# Patient Record
Sex: Male | Born: 1985 | Race: White | Hispanic: No | Marital: Married | Smoking: Former smoker
Health system: Southern US, Community
[De-identification: ages and names within clinical notes are randomized; demographics above are authoritative.]

## PROBLEM LIST (undated history)

## (undated) DIAGNOSIS — K5090 Crohn's disease, unspecified, without complications: Secondary | ICD-10-CM

## (undated) DIAGNOSIS — K648 Other hemorrhoids: Secondary | ICD-10-CM

## (undated) DIAGNOSIS — K611 Rectal abscess: Secondary | ICD-10-CM

## (undated) DIAGNOSIS — K602 Anal fissure, unspecified: Secondary | ICD-10-CM

## (undated) DIAGNOSIS — D239 Other benign neoplasm of skin, unspecified: Secondary | ICD-10-CM

## (undated) HISTORY — PX: WISDOM TOOTH EXTRACTION: SHX21

## (undated) HISTORY — DX: Anal fissure, unspecified: K60.2

## (undated) HISTORY — DX: Other benign neoplasm of skin, unspecified: D23.9

## (undated) HISTORY — DX: Crohn's disease, unspecified, without complications: K50.90

## (undated) HISTORY — DX: Other hemorrhoids: K64.8

## (undated) HISTORY — PX: OTHER SURGICAL HISTORY: SHX169

## (undated) HISTORY — DX: Rectal abscess: K61.1

---

## 2005-03-31 HISTORY — PX: ARTHROSCOPIC REPAIR ACL: SUR80

## 2009-08-22 ENCOUNTER — Ambulatory Visit: Payer: Self-pay | Admitting: Family Medicine

## 2009-08-22 DIAGNOSIS — D239 Other benign neoplasm of skin, unspecified: Secondary | ICD-10-CM | POA: Insufficient documentation

## 2009-08-22 HISTORY — DX: Other benign neoplasm of skin, unspecified: D23.9

## 2009-08-23 LAB — CONVERTED CEMR LAB
AST: 22 units/L (ref 0–37)
Albumin: 4.8 g/dL (ref 3.5–5.2)
Basophils Absolute: 0 10*3/uL (ref 0.0–0.1)
CO2: 28 meq/L (ref 19–32)
Chlamydia, Swab/Urine, PCR: NEGATIVE
Eosinophils Absolute: 0.1 10*3/uL (ref 0.0–0.7)
Glucose, Bld: 78 mg/dL (ref 70–99)
HCT: 44.1 % (ref 39.0–52.0)
Hemoglobin: 15.4 g/dL (ref 13.0–17.0)
Lymphs Abs: 1.6 10*3/uL (ref 0.7–4.0)
MCHC: 35 g/dL (ref 30.0–36.0)
Monocytes Relative: 10.5 % (ref 3.0–12.0)
Neutro Abs: 2.4 10*3/uL (ref 1.4–7.7)
Potassium: 4 meq/L (ref 3.5–5.1)
RDW: 12.8 % (ref 11.5–14.6)
Sodium: 143 meq/L (ref 135–145)
TSH: 1.23 microintl units/mL (ref 0.35–5.50)

## 2009-09-25 ENCOUNTER — Encounter: Payer: Self-pay | Admitting: Family Medicine

## 2009-10-18 ENCOUNTER — Ambulatory Visit: Payer: Self-pay | Admitting: Family Medicine

## 2009-10-18 DIAGNOSIS — L989 Disorder of the skin and subcutaneous tissue, unspecified: Secondary | ICD-10-CM | POA: Insufficient documentation

## 2009-10-18 DIAGNOSIS — D485 Neoplasm of uncertain behavior of skin: Secondary | ICD-10-CM | POA: Insufficient documentation

## 2009-12-25 ENCOUNTER — Encounter: Payer: Self-pay | Admitting: Family Medicine

## 2009-12-27 ENCOUNTER — Encounter: Payer: Self-pay | Admitting: Family Medicine

## 2010-04-30 NOTE — Assessment & Plan Note (Signed)
Summary: TO BE EST AT [facility]/PT WILL COME IN FASTING/[facility]   Vital Signs:  Patient profile:   25 year old male Height:      69.75 inches Weight:      191 pounds BMI:     27.70 Temp:     98 degrees F oral Pulse rate:   60 / minute Pulse rhythm:   regular Resp:     12 per minute BP sitting:   130 / 78  (left arm) Cuff size:   regular  Vitals Entered By: Nira Conn LPN (Aug 22, 5033 46:56 AM)  Nutrition Counseling: Patient's BMI is greater than 25 and therefore counseled on weight management options. CC: new to establish   History of Present Illness: Here to establish.  requests CPE.  Requests STI testing.  Steady girlfriend with hx of HPV. Pt denies any hx of penile lesions.  No signif chronic problems.  FH and SH reviewed.  Preventive Screening-Counseling & Management  Alcohol-Tobacco     Smoking Status: current  Allergies (verified): No Known Drug Allergies  Past History:  Family History: Last updated: 08/22/2009 Family History Diabetes  Family History Hypertension Cancer Stroke  Social History: Last updated: 08/22/2009 Occupation:  Service Single Smoking status, quit Alcohol use-yes  Risk Factors: Smoking Status: current (08/22/2009)  Past Surgical History: Right ACL 2007 Wisdom Teeth 2004  Family History: Family History Diabetes  Family History Hypertension Cancer Stroke  Social History: Occupation:  Service Single Smoking status, quit Alcohol use-yes Smoking Status:  current Occupation:  employed  Review of Systems  The patient denies anorexia, fever, weight loss, weight gain, vision loss, decreased hearing, hoarseness, chest pain, syncope, dyspnea on exertion, peripheral edema, prolonged cough, headaches, hemoptysis, abdominal pain, melena, hematochezia, severe indigestion/heartburn, hematuria, incontinence, genital sores, muscle weakness, suspicious skin lesions, transient blindness, difficulty walking, depression, unusual  weight change, abnormal bleeding, enlarged lymph nodes, and testicular masses.    Physical Exam  General:  Well-developed,well-nourished,in no acute distress; alert,appropriate and cooperative throughout examination Head:  Normocephalic and atraumatic without obvious abnormalities. No apparent alopecia or balding. Eyes:  No corneal or conjunctival inflammation noted. EOMI. Perrla. Funduscopic exam benign, without hemorrhages, exudates or papilledema. Vision grossly normal. Ears:  External ear exam shows no significant lesions or deformities.  Otoscopic examination reveals clear canals, tympanic membranes are intact bilaterally without bulging, retraction, inflammation or discharge. Hearing is grossly normal bilaterally. Mouth:  Oral mucosa and oropharynx without lesions or exudates.  Teeth in good repair. Neck:  No deformities, masses, or tenderness noted. Lungs:  Normal respiratory effort, chest expands symmetrically. Lungs are clear to auscultation, no crackles or wheezes. Heart:  Normal rate and regular rhythm. S1 and S2 normal without gallop, murmur, click, rub or other extra sounds. Abdomen:  Bowel sounds positive,abdomen soft and non-tender without masses, organomegaly or hernias noted. Genitalia:  Testes bilaterally descended without nodularity, tenderness or masses. No scrotal masses or lesions. No penis lesions or urethral discharge. Msk:  No deformity or scoliosis noted of thoracic or lumbar spine.   Extremities:  No clubbing, cyanosis, edema, or deformity noted with normal full range of motion of all joints.   Neurologic:  alert & oriented X3 and cranial nerves II-XII intact.   Skin:  R upper back nevus with slight asymmetry and some color variegation with slight irreg border and about 39m diameter. Cervical Nodes:  No lymphadenopathy noted Psych:  normally interactive, good eye contact, not anxious appearing, and not depressed appearing.     Impression & Recommendations:  Problem  # 1:  ROUTINE GENERAL MEDICAL [email or web address]  CARE FACL (ICD-V70.0) screening labs incl STI screening.  Schedule for nevi excision.  STI prevention discussed. Orders: TLB-Lipid Panel (80061-LIPID) TLB-BMP (Basic Metabolic Panel-BMET) ([id number]) TLB-CBC Platelet - w/Differential (85025-CBCD) TLB-Hepatic/Liver Function Pnl (80076-HEPATIC) TLB-TSH (Thyroid Stimulating Hormone) (84443-TSH) T-Chlamydia  Probe, urine ([id number]) T-HIV Antibody  (Reflex) ([id number]) T-RPR (Syphilis) ([id number])  Problem # 2:  NEVUS, ATYPICAL (ICD-216.9) Assessment: New R upper back.  Pt will schedule to excise.  Other Orders: Venipuncture (31517)  Patient Instructions: 1)  Schedule for nevus excision. 2)  It is important that you exercise reguarly at least 20 minutes 5 times a week. If you develop chest pain, have severe difficulty breathing, or feel very tired, stop exercising immediately and seek medical attention.   Preventive Care Screening  Last Tetanus Booster:    Date:  03/31/2004    Results:  Historical

## 2010-04-30 NOTE — Assessment & Plan Note (Signed)
Summary: nevus excision//[facility]   Vital Signs:  Patient profile:   25 year old male Temp:     98.2 degrees F BP sitting:   110 / 70  (left arm) Cuff size:   regular  Vitals Entered By: Nira Conn LPN (October 18, 7320 0:25 AM)  History of Present Illness: Patient seen for nevus excision. atypical nevus right upper back noted recently on exam. No personal or family history of skin cancer.  Patient denies any itching or bleeding from nevus. Also has nevus mid lower back region which has very benign features but is irritating because of location.  Allergies: No Known Drug Allergies  Physical Exam  General:  Well-developed,well-nourished,in no acute distress; alert,appropriate and cooperative throughout examination Skin:  right upper back reveals atypical nevus with atypical features including some asymmetry, speckled color and slightly irregular border and  7 mm diameter. In the lower back region lower lumbar he has dermal type nevus with very typical features with smooth border and homogenous color and normal symmetry   Impression & Recommendations:  Problem # 1:  NEVUS, ATYPICAL (ICD-216.9)  reviewed risk and benefits of shave excision and patient consented. Prepped with alcohol. Anesthetized with 1% Xylocaine with epinephrine. Shave with #15 blade. Minimal bleeding controlled with Drysol. Same method for benign appearing nevus lower back region  Other Orders: Shave Skin Lesion 0.6-1.0 cm/trunk/arm/leg (42706)  Patient Instructions: 1)  Keep wounds dry for the first 24 hours. 2)  Then clean daily with soap and water. 3)  Use topical antibiotic such as neosporin daily for 4 to 5 days. 4)  Follow up promptly for any redness, drainage, or increased pain or swelling.   Immunization History:  Hepatitis B Immunization History:    Hepatitis B # 1:  historical (09/25/2009)  Tetanus/Td Immunization History:    Tetanus/Td:  historical (09/25/2009)  Hepatitis A Immunization  History:    Hepatitis A # 1:  hepa (09/25/2009)

## 2010-04-30 NOTE — Miscellaneous (Signed)
Summary: Record/[facility]  Record/[facility]   Imported By: Phillis Knack 12/05/2009 10:30:19  _____________________________________________________________________  External Attachment:    Type:   Image     Comment:   External Document

## 2010-04-30 NOTE — Consult Note (Signed)
Summary: [facility]   Imported By: Laural Benes 01/11/2010 12:16:11  _____________________________________________________________________  External Attachment:    Type:   Image     Comment:   External Document

## 2010-08-29 ENCOUNTER — Other Ambulatory Visit: Payer: Self-pay

## 2010-09-27 ENCOUNTER — Other Ambulatory Visit (INDEPENDENT_AMBULATORY_CARE_PROVIDER_SITE_OTHER): Payer: BC Managed Care – PPO

## 2010-09-27 DIAGNOSIS — Z0000 Encounter for general adult medical examination without abnormal findings: Secondary | ICD-10-CM

## 2010-09-27 LAB — CBC WITH DIFFERENTIAL/PLATELET
Basophils Absolute: 0 10*3/uL (ref 0.0–0.1)
Basophils Relative: 0.3 % (ref 0.0–3.0)
Eosinophils Absolute: 0.2 10*3/uL (ref 0.0–0.7)
Lymphocytes Relative: 29.9 % (ref 12.0–46.0)
MCHC: 34.6 g/dL (ref 30.0–36.0)
Neutrophils Relative %: 58.9 % (ref 43.0–77.0)
Platelets: 179 10*3/uL (ref 150.0–400.0)
RBC: 4.69 Mil/uL (ref 4.22–5.81)
RDW: 13.3 % (ref 11.5–14.6)

## 2010-09-27 LAB — LIPID PANEL
HDL: 42.1 mg/dL (ref >39.00)
LDL Cholesterol: 86 mg/dL (ref 0–99)
Total CHOL/HDL Ratio: 4
Triglycerides: 131 mg/dL (ref 0.0–149.0)
VLDL: 26.2 mg/dL (ref 0.0–40.0)

## 2010-09-27 LAB — BASIC METABOLIC PANEL
BUN: 15 mg/dL (ref 6–23)
CO2: 25 mEq/L (ref 19–32)
Calcium: 9 mg/dL (ref 8.4–10.5)
Creatinine, Ser: 0.9 mg/dL (ref 0.4–1.5)

## 2010-09-27 LAB — POCT URINALYSIS DIPSTICK
Blood, UA: NEGATIVE
Leukocytes, UA: NEGATIVE
Nitrite, UA: NEGATIVE
Protein, UA: NEGATIVE
pH, UA: 6

## 2010-09-27 LAB — HEPATIC FUNCTION PANEL
Alkaline Phosphatase: 60 U/L (ref 39–117)
Bilirubin, Direct: 0.1 mg/dL (ref 0.0–0.3)
Total Bilirubin: 0.7 mg/dL (ref 0.3–1.2)

## 2010-10-01 ENCOUNTER — Encounter: Payer: Self-pay | Admitting: Family Medicine

## 2010-10-03 ENCOUNTER — Encounter: Payer: Self-pay | Admitting: Family Medicine

## 2010-10-04 ENCOUNTER — Encounter: Payer: Self-pay | Admitting: Family Medicine

## 2010-10-04 ENCOUNTER — Ambulatory Visit (INDEPENDENT_AMBULATORY_CARE_PROVIDER_SITE_OTHER): Payer: BC Managed Care – PPO | Admitting: Family Medicine

## 2010-10-04 VITALS — BP 120/78 | HR 72 | Temp 98.5°F | Resp 12 | Ht 69.5 in | Wt 195.0 lb

## 2010-10-04 DIAGNOSIS — Z0000 Encounter for general adult medical examination without abnormal findings: Secondary | ICD-10-CM

## 2010-10-04 DIAGNOSIS — D229 Melanocytic nevi, unspecified: Secondary | ICD-10-CM

## 2010-10-04 DIAGNOSIS — D239 Other benign neoplasm of skin, unspecified: Secondary | ICD-10-CM

## 2010-10-04 NOTE — Patient Instructions (Signed)
We will call you regarding dermatology appointment.

## 2010-10-04 NOTE — Progress Notes (Signed)
  Subjective:    Patient ID: Glen Cain, male    DOB: 1985-04-25, 25 y.o.   MRN: [id number]  HPI Patient here for wellness visit. No chronic medical problems. Immunizations up to date. Patient on no medications. History of dysplastic nevus right upper back. Patient is nonsmoker. Exercises fairly frequently.  Past medical history, social history, and family history reviewed and is recorded elsewhere  Past Medical History  Diagnosis Date  . Benign neoplasm of skin, site unspecified 08/22/2009   Past Surgical History  Procedure Date  . Arthroscopic repair acl   . Wisdom tooth extraction     reports that he has quit smoking. He does not have any smokeless tobacco history on file. He reports that he drinks alcohol. His drug history not on file. family history includes Cancer in an unspecified family member; Diabetes in an unspecified family member; Hypertension in an unspecified family member; and Stroke in an unspecified family member. No Known Allergies    Review of Systems  Constitutional: Negative for fever, activity change, appetite change and fatigue.  HENT: Negative for ear pain, congestion and trouble swallowing.   Eyes: Negative for pain and visual disturbance.  Respiratory: Negative for cough, shortness of breath and wheezing.   Cardiovascular: Negative for chest pain and palpitations.  Gastrointestinal: Negative for nausea, vomiting, abdominal pain, diarrhea, constipation, blood in stool, abdominal distention and rectal pain.  Genitourinary: Negative for dysuria, hematuria and testicular pain.  Musculoskeletal: Negative for joint swelling and arthralgias.  Skin: Negative for rash.  Neurological: Negative for dizziness, syncope and headaches.  Hematological: Negative for adenopathy.  Psychiatric/Behavioral: Negative for confusion and dysphoric mood.       Objective:   Physical Exam  Constitutional: He is oriented to person, place, and time. He appears well-developed and  well-nourished. No distress.  HENT:  Head: Normocephalic and atraumatic.  Right Ear: External ear normal.  Left Ear: External ear normal.  Mouth/Throat: Oropharynx is clear and moist.  Eyes: Conjunctivae and EOM are normal. Pupils are equal, round, and reactive to light.  Neck: Normal range of motion. Neck supple. No thyromegaly present.  Cardiovascular: Normal rate, regular rhythm and normal heart sounds.   No murmur heard. Pulmonary/Chest: No respiratory distress. He has no wheezes. He has no rales.  Abdominal: Soft. Bowel sounds are normal. He exhibits no distension and no mass. There is no tenderness. There is no rebound and no guarding.  Musculoskeletal: He exhibits no edema.  Lymphadenopathy:    He has no cervical adenopathy.  Neurological: He is alert and oriented to person, place, and time. He displays normal reflexes. No cranial nerve deficit.  Skin: No rash noted.       Right abdominal wall nevus about 5 mm diameter. Some asymmetry and very darkened in color with some color variegation. Fairly well demarcated border.  Psychiatric: He has a normal mood and affect.          Assessment & Plan:  Health maintenance exam. Labs reviewed with patient. Atypical nevus. Patient has seen dermatologist previously and will refer back

## 2011-11-06 ENCOUNTER — Other Ambulatory Visit (INDEPENDENT_AMBULATORY_CARE_PROVIDER_SITE_OTHER): Payer: BC Managed Care – PPO

## 2011-11-06 DIAGNOSIS — Z0000 Encounter for general adult medical examination without abnormal findings: Secondary | ICD-10-CM

## 2011-11-06 LAB — CBC WITH DIFFERENTIAL/PLATELET
Basophils Absolute: 0 10*3/uL (ref 0.0–0.1)
Lymphocytes Relative: 30.5 % (ref 12.0–46.0)
Monocytes Relative: 8 % (ref 3.0–12.0)
Neutrophils Relative %: 58.6 % (ref 43.0–77.0)
Platelets: 164 10*3/uL (ref 150.0–400.0)
RDW: 12.6 % (ref 11.5–14.6)

## 2011-11-06 LAB — TSH: TSH: 1.42 u[IU]/mL (ref 0.35–5.50)

## 2011-11-06 LAB — LIPID PANEL
LDL Cholesterol: 77 mg/dL (ref 0–99)
VLDL: 17.2 mg/dL (ref 0.0–40.0)

## 2011-11-06 LAB — POCT URINALYSIS DIPSTICK
Protein, UA: NEGATIVE
Spec Grav, UA: 1.01
Urobilinogen, UA: 0.2
pH, UA: 7

## 2011-11-06 LAB — BASIC METABOLIC PANEL
BUN: 13 mg/dL (ref 6–23)
Calcium: 9.2 mg/dL (ref 8.4–10.5)
GFR: 101.46 mL/min (ref >60.00)
Glucose, Bld: 89 mg/dL (ref 70–99)
Sodium: 138 mEq/L (ref 135–145)

## 2011-11-06 LAB — HEPATIC FUNCTION PANEL
AST: 18 U/L (ref 0–37)
Alkaline Phosphatase: 52 U/L (ref 39–117)
Total Bilirubin: 1.2 mg/dL (ref 0.3–1.2)

## 2011-11-20 ENCOUNTER — Ambulatory Visit (INDEPENDENT_AMBULATORY_CARE_PROVIDER_SITE_OTHER): Payer: BC Managed Care – PPO | Admitting: Family Medicine

## 2011-11-20 ENCOUNTER — Encounter: Payer: Self-pay | Admitting: Family Medicine

## 2011-11-20 VITALS — BP 120/80 | Temp 98.1°F | Ht 71.0 in | Wt 197.0 lb

## 2011-11-20 DIAGNOSIS — Z0000 Encounter for general adult medical examination without abnormal findings: Secondary | ICD-10-CM

## 2011-11-20 NOTE — Progress Notes (Signed)
  Subjective:    Patient ID: Glen Cain, male    DOB: 04/26/1985, 26 y.o.   MRN: [id number]  HPI  Here for complete physical. Generally very healthy. Takes no regular medications. Couple of dysplastic nevi previously. No recent skin changes. Started more consistent exercise recently. Tetanus up-to-date.  Has had some recent transient paresthesias left lower leg. No weakness. No low back pain. Symptoms are usually very mild and transient. Not clearly positional other than possibly worse with stretching legs out  Small lipomatous mass left anterior thigh. Nonpainful.  Past Medical History  Diagnosis Date  . Benign neoplasm of skin, site unspecified 08/22/2009   Past Surgical History  Procedure Date  . Arthroscopic repair acl   . Wisdom tooth extraction     reports that he has quit smoking. He does not have any smokeless tobacco history on file. He reports that he drinks alcohol. His drug history not on file. family history includes Cancer in an unspecified family member; Diabetes in an unspecified family member; Hypertension in an unspecified family member; and Stroke in an unspecified family member. No Known Allergies    Review of Systems  Constitutional: Negative for fever, activity change, appetite change and fatigue.  HENT: Negative for ear pain, congestion and trouble swallowing.   Eyes: Negative for pain and visual disturbance.  Respiratory: Negative for cough, shortness of breath and wheezing.   Cardiovascular: Negative for chest pain and palpitations.  Gastrointestinal: Negative for nausea, vomiting, abdominal pain, diarrhea, constipation, blood in stool, abdominal distention and rectal pain.  Genitourinary: Negative for dysuria, hematuria and testicular pain.  Musculoskeletal: Negative for joint swelling and arthralgias.  Skin: Negative for rash.  Neurological: Negative for dizziness, syncope and headaches.  Hematological: Negative for adenopathy.  Psychiatric/Behavioral:  Negative for confusion and dysphoric mood.       Objective:   Physical Exam  Constitutional: He is oriented to person, place, and time. He appears well-developed and well-nourished. No distress.  HENT:  Head: Normocephalic and atraumatic.  Right Ear: External ear normal.  Left Ear: External ear normal.  Mouth/Throat: Oropharynx is clear and moist.  Eyes: Conjunctivae and EOM are normal. Pupils are equal, round, and reactive to light.  Neck: Normal range of motion. Neck supple. No thyromegaly present.  Cardiovascular: Normal rate, regular rhythm and normal heart sounds.   No murmur heard. Pulmonary/Chest: No respiratory distress. He has no wheezes. He has no rales.  Abdominal: Soft. Bowel sounds are normal. He exhibits no distension and no mass. There is no tenderness. There is no rebound and no guarding.  Musculoskeletal: He exhibits no edema.       Left anterior thigh reveals small half centimeter fatty-type mass which is nontender.  Lymphadenopathy:    He has no cervical adenopathy.  Neurological: He is alert and oriented to person, place, and time. He displays normal reflexes. No cranial nerve deficit.  Skin: No rash noted.  Psychiatric: He has a normal mood and affect.          Assessment & Plan:  Complete physical. Healthy 26 year old male. Labs reviewed. Tetanus up-to-date. Discussed healthy lifestyle behaviors.  Small lipoma left thigh-reassurance.

## 2011-11-20 NOTE — Patient Instructions (Addendum)
Lipoma A lipoma is a noncancerous (benign) tumor composed of fat cells. They are usually found under the skin (subcutaneous). A lipoma may occur in any tissue of the body that contains fat. Common areas for lipomas to appear include the back, shoulders, buttocks, and thighs. Lipomas are a very common soft tissue growth. They are soft and grow slowly. Most problems caused by a lipoma depend on where it is growing. DIAGNOSIS  A lipoma can be diagnosed with a physical exam. These tumors rarely become cancerous, but radiographic studies can help determine this for certain. Studies used may include:  Computerized X-ray scans (CT or CAT scan).   Computerized magnetic scans (MRI).  TREATMENT  Small lipomas that are not causing problems may be watched. If a lipoma continues to enlarge or causes problems, removal is often the best treatment. Lipomas can also be removed to improve appearance. Surgery is done to remove the fatty cells and the surrounding capsule. Most often, this is done with medicine that numbs the area (local anesthetic). The removed tissue is examined under a microscope to make sure it is not cancerous. Keep all follow-up appointments with your caregiver. SEEK MEDICAL CARE IF:   The lipoma becomes larger or hard.   The lipoma becomes painful, red, or increasingly swollen. These could be signs of infection or a more serious condition.  Document Released: 03/07/2002 Document Revised: 03/06/2011 Document Reviewed: 08/17/2009 [facility] Patient Information 2012 [place].

## 2012-10-29 ENCOUNTER — Other Ambulatory Visit (INDEPENDENT_AMBULATORY_CARE_PROVIDER_SITE_OTHER): Payer: 59

## 2012-10-29 DIAGNOSIS — Z0000 Encounter for general adult medical examination without abnormal findings: Secondary | ICD-10-CM

## 2012-10-29 LAB — CBC WITH DIFFERENTIAL/PLATELET
Basophils Absolute: 0 10*3/uL (ref 0.0–0.1)
Basophils Relative: 0.3 % (ref 0.0–3.0)
Eosinophils Absolute: 0.2 10*3/uL (ref 0.0–0.7)
Lymphocytes Relative: 26.1 % (ref 12.0–46.0)
MCHC: 34.2 g/dL (ref 30.0–36.0)
Monocytes Absolute: 0.7 10*3/uL (ref 0.1–1.0)
Neutrophils Relative %: 61.8 % (ref 43.0–77.0)
Platelets: 203 10*3/uL (ref 150.0–400.0)
RBC: 4.81 Mil/uL (ref 4.22–5.81)
RDW: 12.9 % (ref 11.5–14.6)

## 2012-10-29 LAB — LIPID PANEL
Cholesterol: 137 mg/dL (ref 0–200)
HDL: 36.4 mg/dL — ABNORMAL LOW (ref >39.00)
LDL Cholesterol: 84 mg/dL (ref 0–99)
Triglycerides: 81 mg/dL (ref 0.0–149.0)
VLDL: 16.2 mg/dL (ref 0.0–40.0)

## 2012-10-29 LAB — POCT URINALYSIS DIPSTICK
Bilirubin, UA: NEGATIVE
Ketones, UA: NEGATIVE
Protein, UA: NEGATIVE
Spec Grav, UA: 1.015

## 2012-10-29 LAB — BASIC METABOLIC PANEL
BUN: 14 mg/dL (ref 6–23)
CO2: 28 mEq/L (ref 19–32)
Calcium: 9.4 mg/dL (ref 8.4–10.5)
Creatinine, Ser: 1 mg/dL (ref 0.4–1.5)

## 2012-10-29 LAB — HEPATIC FUNCTION PANEL
Alkaline Phosphatase: 56 U/L (ref 39–117)
Bilirubin, Direct: 0.1 mg/dL (ref 0.0–0.3)
Total Bilirubin: 0.8 mg/dL (ref 0.3–1.2)

## 2012-11-03 ENCOUNTER — Ambulatory Visit (INDEPENDENT_AMBULATORY_CARE_PROVIDER_SITE_OTHER): Payer: 59 | Admitting: Family Medicine

## 2012-11-03 ENCOUNTER — Encounter: Payer: Self-pay | Admitting: Family Medicine

## 2012-11-03 VITALS — BP 130/64 | HR 64 | Temp 98.6°F | Ht 69.0 in | Wt 191.0 lb

## 2012-11-03 DIAGNOSIS — Z0000 Encounter for general adult medical examination without abnormal findings: Secondary | ICD-10-CM

## 2012-11-03 NOTE — Progress Notes (Signed)
  Subjective:    Patient ID: Glen Cain, male    DOB: 1985/08/09, 27 y.o.   MRN: [id number]  HPI Patient here for complete physical. No significant chronic medical problems.  Exercises several days per week. Tetanus 2011. He complains of some occasional cold hands and cold feet. No Raynaud's. Good exercise capacity, generally. Nonsmoker  Past Medical History  Diagnosis Date  . Benign neoplasm of skin, site unspecified 08/22/2009   Past Surgical History  Procedure Laterality Date  . Arthroscopic repair acl    . Wisdom tooth extraction      reports that he has quit smoking. He does not have any smokeless tobacco history on file. He reports that  drinks alcohol. His drug history is not on file. family history includes Cancer in an unspecified family member; Diabetes in an unspecified family member; Hypertension in an unspecified family member; and Stroke in an unspecified family member. No Known Allergies    Review of Systems  Constitutional: Negative for fever, activity change, appetite change and fatigue.  HENT: Negative for ear pain, congestion and trouble swallowing.   Eyes: Negative for pain and visual disturbance.  Respiratory: Negative for cough, shortness of breath and wheezing.   Cardiovascular: Negative for chest pain and palpitations.  Gastrointestinal: Negative for nausea, vomiting, abdominal pain, diarrhea, constipation, blood in stool, abdominal distention and rectal pain.  Genitourinary: Negative for dysuria, hematuria and testicular pain.  Musculoskeletal: Negative for joint swelling and arthralgias.  Skin: Negative for rash.  Neurological: Negative for dizziness, syncope and headaches.  Hematological: Negative for adenopathy.  Psychiatric/Behavioral: Negative for confusion and dysphoric mood.       Objective:   Physical Exam  Constitutional: He is oriented to person, place, and time. He appears well-developed and well-nourished. No distress.  HENT:  Head:  Normocephalic and atraumatic.  Right Ear: External ear normal.  Left Ear: External ear normal.  Mouth/Throat: Oropharynx is clear and moist.  Eyes: Conjunctivae and EOM are normal. Pupils are equal, round, and reactive to light.  Neck: Normal range of motion. Neck supple. No thyromegaly present.  Cardiovascular: Normal rate, regular rhythm and normal heart sounds.   No murmur heard. Pulmonary/Chest: No respiratory distress. He has no wheezes. He has no rales.  Abdominal: Soft. Bowel sounds are normal. He exhibits no distension and no mass. There is no tenderness. There is no rebound and no guarding.  Musculoskeletal: He exhibits no edema.  Lymphadenopathy:    He has no cervical adenopathy.  Neurological: He is alert and oriented to person, place, and time. He displays normal reflexes. No cranial nerve deficit.  Skin: No rash noted.  Psychiatric: He has a normal mood and affect.          Assessment & Plan:  Healthy 27 year old male. Labs reviewed with patient. Mildly low HDL otherwise normal. Continue regular exercise habits. Tetanus up-to-date. Followup in one year for physical and sooner as needed

## 2013-06-13 NOTE — Pre-Procedure Instructions (Addendum)
Glen Cain  06/13/2013   Your procedure is scheduled on:  Thursday, March 19th   Report to [facility] short stay admitting at 8:00  AM.             [facility] Parking is available)  Call this number if you have problems the morning of surgery: [phone]   Remember:   Do not eat food or drink liquids after midnight Wednesday.   Take these medicines the morning of surgery with A SIP OF WATER:  NONE           STOP all herbel meds, nsaids (aleve,naproxen,advil,ibuprofen) now including vitamins ,aspirin   Do not wear jewelry-no rings or watches.  Do not wear lotions or colognes.  You may NOT wear deodorant.   Men may shave face and neck.   Do not bring valuables to the hospital.  [facility] is not responsible for any belongings or valuables.               Contacts, dentures or bridgework may not be worn into surgery.  Leave suitcase in the car. After surgery it may be brought to your room.                Patients discharged the day of surgery will not be allowed to drive home, and will need someone to stay with you for 24 hrs after surgery.   Name and phone number of your driver:    Special Instruction:  Special Instructions: [facility] - Preparing for Surgery  Before surgery, you can play an important role.  Because skin is not sterile, your skin needs to be as free of germs as possible.  You can reduce the number of germs on you skin by washing with CHG (chlorahexidine gluconate) soap before surgery.  CHG is an antiseptic cleaner which kills germs and bonds with the skin to continue killing germs even after washing.  Please DO NOT use if you have an allergy to CHG or antibacterial soaps.  If your skin becomes reddened/irritated stop using the CHG and inform your nurse when you arrive at Short Stay.  Do not shave (including legs and underarms) for at least 48 hours prior to the first CHG shower.  You may shave your face.  Please follow these instructions carefully:   1.   Shower with CHG Soap the night before surgery and the morning of Surgery.  2.  If you choose to wash your hair, wash your hair first as usual with your normal shampoo.  3.  After you shampoo, rinse your hair and body thoroughly to remove the Shampoo.  4.  Use CHG as you would any other liquid soap.  You can apply chg directly  to the skin and wash gently with scrungie or a clean washcloth.  5.  Apply the CHG Soap to your body ONLY FROM THE NECK DOWN.  Do not use on open wounds or open sores.  Avoid contact with your eyes ears, mouth and genitals (private parts).  Wash genitals (private parts)       with your normal soap.  6.  Wash thoroughly, paying special attention to the area where your surgery will be performed.  7.  Thoroughly rinse your body with warm water from the neck down.  8.  DO NOT shower/wash with your normal soap after using and rinsing off the CHG Soap.  9.  Pat yourself dry with a clean towel.  10.  Wear clean pajamas.            11.  Place clean sheets on your bed the night of your first shower and do not sleep with pets.  Day of Surgery  Do not apply any lotions/deodorants the morning of surgery.  Please wear clean clothes to the hospital/surgery center.   Please read over the following fact sheets that you were given: Pain Booklet and Surgical Site Infection Prevention

## 2013-06-14 ENCOUNTER — Encounter (HOSPITAL_COMMUNITY)
Admission: RE | Admit: 2013-06-14 | Discharge: 2013-06-14 | Disposition: A | Discharge status: Home / Self Care | Payer: 59 | Source: Ambulatory Visit | Attending: Orthopedic Surgery | Admitting: Orthopedic Surgery

## 2013-06-14 ENCOUNTER — Encounter (HOSPITAL_COMMUNITY): Payer: Self-pay

## 2013-06-14 LAB — COMPREHENSIVE METABOLIC PANEL
ALT: 13 U/L (ref 0–53)
AST: 18 U/L (ref 0–37)
Albumin: 3.9 g/dL (ref 3.5–5.2)
Alkaline Phosphatase: 76 U/L (ref 39–117)
BILIRUBIN TOTAL: 0.4 mg/dL (ref 0.3–1.2)
BUN: 17 mg/dL (ref 6–23)
CALCIUM: 9.5 mg/dL (ref 8.4–10.5)
CO2: 25 mEq/L (ref 19–32)
CREATININE: 0.9 mg/dL (ref 0.50–1.35)
Chloride: 103 mEq/L (ref 96–112)
GFR calc Af Amer: >90 mL/min (ref >90)
Glucose, Bld: 94 mg/dL (ref 70–99)
Potassium: 4.6 mEq/L (ref 3.7–5.3)
Sodium: 142 mEq/L (ref 137–147)
Total Protein: 7 g/dL (ref 6.0–8.3)

## 2013-06-14 LAB — CBC WITH DIFFERENTIAL/PLATELET
Basophils Absolute: 0 10*3/uL (ref 0.0–0.1)
Basophils Relative: 0 % (ref 0–1)
EOS PCT: 3 % (ref 0–5)
Eosinophils Absolute: 0.2 10*3/uL (ref 0.0–0.7)
HEMATOCRIT: 42.6 % (ref 39.0–52.0)
HEMOGLOBIN: 15.4 g/dL (ref 13.0–17.0)
LYMPHS PCT: 25 % (ref 12–46)
Lymphs Abs: 2 10*3/uL (ref 0.7–4.0)
MCH: 31.6 pg (ref 26.0–34.0)
MCHC: 36.2 g/dL — ABNORMAL HIGH (ref 30.0–36.0)
MCV: 87.3 fL (ref 78.0–100.0)
MONO ABS: 0.9 10*3/uL (ref 0.1–1.0)
MONOS PCT: 11 % (ref 3–12)
Neutro Abs: 5 10*3/uL (ref 1.7–7.7)
Neutrophils Relative %: 61 % (ref 43–77)
Platelets: 205 10*3/uL (ref 150–400)
RBC: 4.88 MIL/uL (ref 4.22–5.81)
RDW: 12.6 % (ref 11.5–15.5)
WBC: 8.1 10*3/uL (ref 4.0–10.5)

## 2013-06-14 LAB — APTT: aPTT: 32 seconds (ref 24–37)

## 2013-06-14 LAB — PROTIME-INR
INR: 1.04 (ref 0.00–1.49)
Prothrombin Time: 13.4 seconds (ref 11.6–15.2)

## 2013-06-15 MED ORDER — CEFAZOLIN SODIUM-DEXTROSE 2-3 GM-% IV SOLR
2.0000 g | INTRAVENOUS | Status: AC
  Administered 2013-06-16: 2 g via INTRAVENOUS
  Filled 2013-06-15: qty 50

## 2013-06-16 ENCOUNTER — Ambulatory Visit (HOSPITAL_COMMUNITY): Payer: 59 | Admitting: Anesthesiology

## 2013-06-16 ENCOUNTER — Encounter (HOSPITAL_COMMUNITY): Payer: 59 | Admitting: Anesthesiology

## 2013-06-16 ENCOUNTER — Encounter (HOSPITAL_COMMUNITY): Payer: Self-pay | Admitting: *Deleted

## 2013-06-16 ENCOUNTER — Ambulatory Visit (HOSPITAL_COMMUNITY)
Admission: RE | Admit: 2013-06-16 | Discharge: 2013-06-16 | Disposition: A | Discharge status: Home / Self Care | Payer: 59 | Source: Ambulatory Visit | Attending: Orthopedic Surgery | Admitting: Orthopedic Surgery

## 2013-06-16 ENCOUNTER — Encounter (HOSPITAL_COMMUNITY)
Admission: RE | Disposition: A | Discharge status: Home / Self Care | Payer: Self-pay | Source: Ambulatory Visit | Attending: Orthopedic Surgery

## 2013-06-16 DIAGNOSIS — Z87891 Personal history of nicotine dependence: Secondary | ICD-10-CM | POA: Insufficient documentation

## 2013-06-16 DIAGNOSIS — X58XXXA Exposure to other specified factors, initial encounter: Secondary | ICD-10-CM | POA: Insufficient documentation

## 2013-06-16 DIAGNOSIS — IMO0002 Reserved for concepts with insufficient information to code with codable children: Secondary | ICD-10-CM | POA: Insufficient documentation

## 2013-06-16 DIAGNOSIS — Y9373 Activity, racquet and hand sports: Secondary | ICD-10-CM | POA: Insufficient documentation

## 2013-06-16 HISTORY — PX: KNEE ARTHROSCOPY: SHX127

## 2013-06-16 SURGERY — ARTHROSCOPY, KNEE
Anesthesia: General | Site: Knee | Laterality: Right

## 2013-06-16 MED ORDER — BUPIVACAINE HCL (PF) 0.25 % IJ SOLN
INTRAMUSCULAR | Status: DC | PRN
  Administered 2013-06-16: 30 mL

## 2013-06-16 MED ORDER — MIDAZOLAM HCL 2 MG/2ML IJ SOLN
INTRAMUSCULAR | Status: AC
  Filled 2013-06-16: qty 2

## 2013-06-16 MED ORDER — KETOROLAC TROMETHAMINE 30 MG/ML IJ SOLN
INTRAMUSCULAR | Status: AC
  Filled 2013-06-16: qty 1

## 2013-06-16 MED ORDER — ONDANSETRON HCL 4 MG/2ML IJ SOLN
INTRAMUSCULAR | Status: DC | PRN
  Administered 2013-06-16: 4 mg via INTRAVENOUS

## 2013-06-16 MED ORDER — PROPOFOL 10 MG/ML IV BOLUS
INTRAVENOUS | Status: AC
  Filled 2013-06-16: qty 20

## 2013-06-16 MED ORDER — BUPIVACAINE HCL (PF) 0.25 % IJ SOLN
INTRAMUSCULAR | Status: AC
  Filled 2013-06-16: qty 30

## 2013-06-16 MED ORDER — OXYCODONE-ACETAMINOPHEN 5-325 MG PO TABS
2.0000 | ORAL_TABLET | Freq: Once | ORAL | Status: AC
  Administered 2013-06-16: 2 via ORAL

## 2013-06-16 MED ORDER — OXYCODONE HCL 5 MG/5ML PO SOLN: 5.0000 mg | Freq: Once | ORAL | Status: DC | PRN

## 2013-06-16 MED ORDER — MORPHINE SULFATE 4 MG/ML IJ SOLN
INTRAMUSCULAR | Status: DC | PRN
  Administered 2013-06-16: 4 mg via INTRAVENOUS

## 2013-06-16 MED ORDER — ONDANSETRON HCL 4 MG/2ML IJ SOLN
INTRAMUSCULAR | Status: AC
  Filled 2013-06-16: qty 2

## 2013-06-16 MED ORDER — OXYCODONE-ACETAMINOPHEN 5-325 MG PO TABS: 1.0000 | ORAL_TABLET | ORAL | Status: DC | PRN

## 2013-06-16 MED ORDER — OXYCODONE HCL 5 MG PO TABS: 5.0000 mg | ORAL_TABLET | Freq: Once | ORAL | Status: DC | PRN

## 2013-06-16 MED ORDER — PROPOFOL 10 MG/ML IV BOLUS
INTRAVENOUS | Status: DC | PRN
  Administered 2013-06-16: 170 mg via INTRAVENOUS

## 2013-06-16 MED ORDER — KETOROLAC TROMETHAMINE 30 MG/ML IJ SOLN
30.0000 mg | Freq: Once | INTRAMUSCULAR | Status: AC
  Administered 2013-06-16: 30 mg via INTRAVENOUS

## 2013-06-16 MED ORDER — CHLORHEXIDINE GLUCONATE 4 % EX LIQD
60.0000 mL | Freq: Once | CUTANEOUS | Status: DC
  Filled 2013-06-16: qty 60

## 2013-06-16 MED ORDER — FENTANYL CITRATE 0.05 MG/ML IJ SOLN
INTRAMUSCULAR | Status: DC | PRN
  Administered 2013-06-16: 100 ug via INTRAVENOUS
  Administered 2013-06-16 (×2): 25 ug via INTRAVENOUS

## 2013-06-16 MED ORDER — MIDAZOLAM HCL 5 MG/5ML IJ SOLN
INTRAMUSCULAR | Status: DC | PRN
  Administered 2013-06-16: 2 mg via INTRAVENOUS

## 2013-06-16 MED ORDER — HYDROMORPHONE HCL PF 1 MG/ML IJ SOLN: 0.2500 mg | INTRAMUSCULAR | Status: DC | PRN

## 2013-06-16 MED ORDER — LIDOCAINE HCL (CARDIAC) 20 MG/ML IV SOLN
INTRAVENOUS | Status: AC
  Filled 2013-06-16: qty 5

## 2013-06-16 MED ORDER — FENTANYL CITRATE 0.05 MG/ML IJ SOLN
INTRAMUSCULAR | Status: AC
  Filled 2013-06-16: qty 2

## 2013-06-16 MED ORDER — OXYCODONE-ACETAMINOPHEN 5-325 MG PO TABS
ORAL_TABLET | ORAL | Status: AC
  Administered 2013-06-16: 2 via ORAL
  Filled 2013-06-16: qty 2

## 2013-06-16 MED ORDER — SODIUM CHLORIDE 0.9 % IR SOLN
Status: DC | PRN
  Administered 2013-06-16: 6000 mL

## 2013-06-16 MED ORDER — PROMETHAZINE HCL 25 MG/ML IJ SOLN: 6.2500 mg | INTRAMUSCULAR | Status: DC | PRN

## 2013-06-16 MED ORDER — LACTATED RINGERS IV SOLN
INTRAVENOUS | Status: DC
  Administered 2013-06-16: 08:00:00 via INTRAVENOUS

## 2013-06-16 MED ORDER — MORPHINE SULFATE 4 MG/ML IJ SOLN
INTRAMUSCULAR | Status: AC
  Filled 2013-06-16: qty 1

## 2013-06-16 MED ORDER — FENTANYL CITRATE 0.05 MG/ML IJ SOLN
INTRAMUSCULAR | Status: AC
  Filled 2013-06-16: qty 5

## 2013-06-16 SURGICAL SUPPLY — 40 items
BANDAGE ELASTIC 6 VELCRO ST LF (GAUZE/BANDAGES/DRESSINGS) ×2 IMPLANT
BLADE CUDA 5.5 (BLADE) IMPLANT
BLADE CUTTER GATOR 3.5 (BLADE) ×2 IMPLANT
BLADE GREAT WHITE 4.2 (BLADE) ×2 IMPLANT
BOOTCOVER CLEANROOM LRG (PROTECTIVE WEAR) ×8 IMPLANT
DRAPE ARTHROSCOPY W/POUCH 114 (DRAPES) ×2 IMPLANT
DRSG PAD ABDOMINAL 8X10 ST (GAUZE/BANDAGES/DRESSINGS) ×4 IMPLANT
DURAPREP 26ML APPLICATOR (WOUND CARE) ×2 IMPLANT
GLOVE BIO SURGEON STRL SZ7.5 (GLOVE) ×2 IMPLANT
GLOVE BIO SURGEON STRL SZ8 (GLOVE) ×2 IMPLANT
GLOVE BIO SURGEON STRL SZ8.5 (GLOVE) ×2 IMPLANT
GLOVE BIOGEL PI IND STRL 7.0 (GLOVE) ×1 IMPLANT
GLOVE BIOGEL PI INDICATOR 7.0 (GLOVE) ×1
GLOVE EUDERMIC 7 POWDERFREE (GLOVE) ×2 IMPLANT
GLOVE SS BIOGEL STRL SZ 7.5 (GLOVE) ×1 IMPLANT
GLOVE SUPERSENSE BIOGEL SZ 7.5 (GLOVE) ×1
GOWN STRL REUS W/ TWL LRG LVL3 (GOWN DISPOSABLE) ×1 IMPLANT
GOWN STRL REUS W/ TWL XL LVL3 (GOWN DISPOSABLE) ×3 IMPLANT
GOWN STRL REUS W/TWL LRG LVL3 (GOWN DISPOSABLE) ×1
GOWN STRL REUS W/TWL XL LVL3 (GOWN DISPOSABLE) ×3
KIT BASIN OR (CUSTOM PROCEDURE TRAY) ×2 IMPLANT
KIT ROOM TURNOVER OR (KITS) ×2 IMPLANT
MANIFOLD NEPTUNE II (INSTRUMENTS) ×2 IMPLANT
NEEDLE 18GX1X1/2 (RX/OR ONLY) (NEEDLE) ×2 IMPLANT
PACK ARTHROSCOPY DSU (CUSTOM PROCEDURE TRAY) ×2 IMPLANT
PAD ARMBOARD 7.5X6 YLW CONV (MISCELLANEOUS) ×4 IMPLANT
PADDING CAST ABS 6INX4YD NS (CAST SUPPLIES) ×1
PADDING CAST ABS COTTON 6X4 NS (CAST SUPPLIES) ×1 IMPLANT
PADDING CAST COTTON 6X4 STRL (CAST SUPPLIES) ×2 IMPLANT
RING FOAM WHITE (MISCELLANEOUS) ×2 IMPLANT
SET ARTHROSCOPY TUBING (MISCELLANEOUS) ×1
SET ARTHROSCOPY TUBING LN (MISCELLANEOUS) ×1 IMPLANT
SPONGE GAUZE 4X4 12PLY (GAUZE/BANDAGES/DRESSINGS) ×2 IMPLANT
SPONGE GAUZE 4X4 12PLY STER LF (GAUZE/BANDAGES/DRESSINGS) ×2 IMPLANT
SPONGE LAP 4X18 X RAY DECT (DISPOSABLE) ×2 IMPLANT
STRIP CLOSURE SKIN 1/2X4 (GAUZE/BANDAGES/DRESSINGS) ×2 IMPLANT
SYR 30ML LL (SYRINGE) ×2 IMPLANT
TOWEL OR 17X24 6PK STRL BLUE (TOWEL DISPOSABLE) ×2 IMPLANT
WAND 90 DEG TURBOVAC W/CORD (SURGICAL WAND) ×2 IMPLANT
WATER STERILE IRR 1000ML POUR (IV SOLUTION) ×2 IMPLANT

## 2013-06-16 NOTE — Anesthesia Procedure Notes (Signed)
Procedure Name: Intubation Date/Time: 06/16/2013 10:13 AM Performed by: Rush Farmer E Pre-anesthesia Checklist: Patient identified, Emergency Drugs available, Suction available, Patient being monitored and Timeout performed Patient Re-evaluated:Patient Re-evaluated prior to inductionOxygen Delivery Method: Circle system utilized Preoxygenation: Pre-oxygenation with 100% oxygen Intubation Type: IV induction Ventilation: Mask ventilation without difficulty LMA: LMA inserted LMA Size: 4.0 Number of attempts: 1 Placement Confirmation: positive ETCO2 and breath sounds checked- equal and bilateral Tube secured with: Tape Dental Injury: Teeth and Oropharynx as per pre-operative assessment

## 2013-06-16 NOTE — Anesthesia Preprocedure Evaluation (Addendum)
Anesthesia Evaluation  Patient identified by MRN, date of birth, ID band Patient awake    Reviewed: Allergy & Precautions, H&P , NPO status , Patient's Chart, lab work & pertinent test results, reviewed documented beta blocker date and time   History of Anesthesia Complications Negative for: history of anesthetic complications  Airway Mallampati: II TM Distance: >3 FB Neck ROM: Full    Dental  (+) Teeth Intact, Dental Advisory Given   Pulmonary neg pulmonary ROS, former smoker,  breath sounds clear to auscultation        Cardiovascular negative cardio ROS  Rhythm:regular Rate:Normal     Neuro/Psych negative neurological ROS  negative psych ROS   GI/Hepatic negative GI ROS, Neg liver ROS,   Endo/Other  negative endocrine ROS  Renal/GU negative Renal ROS     Musculoskeletal   Abdominal   Peds  Hematology   Anesthesia Other Findings   Reproductive/Obstetrics negative OB ROS                         Anesthesia Physical Anesthesia Plan  ASA: I  Anesthesia Plan: General LMA   Post-op Pain Management:    Induction:   Airway Management Planned:   Additional Equipment:   Intra-op Plan:   Post-operative Plan: Extubation in OR  Informed Consent: I have reviewed the patients History and Physical, chart, labs and discussed the procedure including the risks, benefits and alternatives for the proposed anesthesia with the patient or authorized representative who has indicated his/her understanding and acceptance.   Dental advisory given  Plan Discussed with: Anesthesiologist, CRNA and Surgeon  Anesthesia Plan Comments:        Anesthesia Quick Evaluation

## 2013-06-16 NOTE — Op Note (Signed)
06/16/2013  11:10 AM  PATIENT:   Glen Cain  28 y.o. male  PRE-OPERATIVE DIAGNOSIS:  RIGHT KNEE MEDIAL MENISCUS TEAR  POST-OPERATIVE DIAGNOSIS:  same  PROCEDURE:  RKA, subtotal medial menisectomy  SURGEON:  Annye Forrey, Metta Clines M.D.  ASSISTANTS: Shuford pac   ANESTHESIA:   LMA + local  EBL: minimal  SPECIMEN:  none  Drains: none   PATIENT DISPOSITION:  PACU - hemodynamically stable.    PLAN OF CARE: Discharge to home after PACU  Dictation# [id number]

## 2013-06-16 NOTE — Preoperative (Signed)
Beta Blockers   Reason not to administer Beta Blockers:Not Applicable 

## 2013-06-16 NOTE — Transfer of Care (Signed)
Immediate Anesthesia Transfer of Care Note  Patient: Glen Cain  Procedure(s) Performed: Procedure(s): RIGHT ARTHROSCOPY KNEE MENISECTOMY WITH DEBRIDEMENT  (Right)  Patient Location: PACU  Anesthesia Type:General  Level of Consciousness: awake, alert  and oriented  Airway & Oxygen Therapy: Patient Spontanous Breathing and Patient connected to nasal cannula oxygen  Post-op Assessment: Report given to PACU RN and Post -op Vital signs reviewed and stable  Post vital signs: Reviewed and stable  Complications: No apparent anesthesia complications

## 2013-06-16 NOTE — H&P (Signed)
Glen Cain    Chief Complaint: RIGHT KNEE MEDIAL MENISCUS TEAR HPI: The patient is a 28 y.o. male with displaced right knee bucket handle medial meniscus tear  Past Medical History  Diagnosis Date  . Benign neoplasm of skin, site unspecified 08/22/2009    Past Surgical History  Procedure Laterality Date  . Arthroscopic repair acl Right 07  . Wisdom tooth extraction      Family History  Problem Relation Age of Onset  . Diabetes      fhx  . Hypertension      fhx  . Cancer      fhx  . Stroke      fhx    Social History:  reports that he quit smoking about 4 years ago. His smoking use included Cigarettes. He smoked 0.00 packs per day. He does not have any smokeless tobacco history on file. He reports that he drinks about 3.6 ounces of alcohol per week. He reports that he does not use illicit drugs.  Allergies: No Known Allergies  Medications Prior to Admission  Medication Sig Dispense Refill  . Multiple Vitamin (MULTIVITAMIN) tablet Take 1 tablet by mouth daily.      . naproxen sodium (ANAPROX) 220 MG tablet Take 220 mg by mouth 2 (two) times daily with a meal.         Physical Exam: right knee with flexion contracture and locked knee as noted at recent office visit  Vitals  Temp:  [98.1 F (36.7 C)] 98.1 F (36.7 C) (03/19 0815) Pulse Rate:  [70] 70 (03/19 0815) Resp:  [18] 18 (03/19 0815) BP: (127-141)/(72-86) 127/72 mmHg (03/19 0905) SpO2:  [97 %] 97 % (03/19 0815)  Assessment/Plan  Impression: RIGHT KNEE MEDIAL MENISCUS TEAR  Plan of Action: Procedure(s): RIGHT ARTHROSCOPY KNEE WITH DEBRIDEMENT VERSES REPAIR OF MENISCUS  Denece Shearer M 06/16/2013, 9:39 AM

## 2013-06-16 NOTE — Discharge Instructions (Signed)
Metta Clines. Supple, M.D., F.A.A.O.S. Orthopa[facility] Specializing in Arthroscopic and Reconstructive Surgery of the Shoulder and Knee [phone] [address] [place],  [address] - Fax [phone]   POST-OP KNEE ARTHROSCOPY INSTRUCTIONS  Pain You will be expected to have a moderate amount of pain in the affected knee for approximately two weeks. However, the first two days will be the most severe pain. A prescription has been provided to take as needed for the pain. The pain can be reduced by applying ice packs to the knee for the first 1-2 weeks post surgery. Also, keeping the leg elevated on pillows will help alleviate the pain. If you develop any acute pain or swelling in your calf muscle, please call the doctor.  Activity It is preferred that you stay at bed rest for approximately 24 hours. However, you may go to the bathroom with help. Weight bearing as tolerated. You may begin the knee exercises the day of surgery. Discontinue crutches as the knee pain resolves.  Dressing Keep the dressing dry. If the ace bandage should wrinkle or roll up, this can be rewrapped to prevent ridges in the bandage. You may remove all dressings in 48 hours, leave steri-strips in place and apply bandaids to each wound. You may shower on the 3rd day after surgery but no tub bath. Use the support stocking to help reduce swelling until your follow-up visit in the office.  Symptoms to report to your doctor Extreme pain Extreme swelling Temperature above 101 degrees Change in the feeling, color, or movement of your toes Redness, heat, or swelling at your incision  Exercise If is preferred that as soon as possible you try to do a straight leg raise without bending the knee and concentrate on bringing the heel of your foot off the bed up to approximately 45 degrees and hold for the count of 10 seconds. Repeat this at least 10 times three or four times per day. Additional exercises are  provided below.  You are encouraged to bend the knee as tolerated.  Follow-Up Call to schedule a follow-up appointment in 7-10 days.  POST-OP EXERCISES  Short Arc Quads  1. Lie on back with legs straight. Place towel roll under thigh, just above knee. 2. Tighten thigh muscles to straighten knee and lift heel off bed. 3. Hold for slow count of five, then lower. 4. Do three sets of ten    Straight Leg Raises  1. Lie on back with operative leg straight and other leg bent. 2. Keeping operative leg completely straight, slowly lift operative leg so foot is 5 inches off bed. 3. Hold for slow count of five, then lower. 4. Do three sets of ten.    DO BOTH EXERCISES 2 TIMES A DAY  Ankle Pumps  Work/move the operative ankle and foot up and down 10 times every hour while awake.  What to eat:  For your first meals, you should eat lightly; only small meals initially.  If you do not have nausea, you may eat larger meals.  Avoid spicy, greasy and heavy food.    General Anesthesia, Adult, Care After  Refer to this sheet in the next few weeks. These instructions provide you with information on caring for yourself after your procedure. Your health care provider may also give you more specific instructions. Your treatment has been planned according to current medical practices, but problems sometimes occur. Call your health care provider if you have any problems or questions after your procedure.  WHAT TO EXPECT AFTER THE PROCEDURE  After the procedure, it is typical to experience:  Sleepiness.  Nausea and vomiting. HOME CARE INSTRUCTIONS  For the first 24 hours after general anesthesia:  Have a responsible person with you.  Do not drive a car. If you are alone, do not take public transportation.  Do not drink alcohol.  Do not take medicine that has not been prescribed by your health care provider.  Do not sign important papers or make important decisions.  You may resume a normal diet  and activities as directed by your health care provider.  Change bandages (dressings) as directed.  If you have questions or problems that seem related to general anesthesia, call the hospital and ask for the anesthetist or anesthesiologist on call. SEEK MEDICAL CARE IF:  You have nausea and vomiting that continue the day after anesthesia.  You develop a rash. SEEK IMMEDIATE MEDICAL CARE IF:  You have difficulty breathing.  You have chest pain.  You have any allergic problems. Document Released: 06/23/2000 Document Revised: 11/17/2012 Document Reviewed: 09/30/2012  [facility] Patient Information 2014 [place], [place].

## 2013-06-17 ENCOUNTER — Encounter (HOSPITAL_COMMUNITY): Payer: Self-pay | Admitting: Orthopedic Surgery

## 2013-06-17 NOTE — Anesthesia Postprocedure Evaluation (Signed)
Anesthesia Post Note  Patient: Glen Cain  Procedure(s) Performed: Procedure(s) (LRB): RIGHT ARTHROSCOPY KNEE MENISECTOMY WITH DEBRIDEMENT  (Right)  Anesthesia type: general  Patient location: PACU  Post pain: Pain level controlled  Post assessment: Patient's Cardiovascular Status Stable  Last Vitals:  Filed Vitals:   06/16/13 1228  BP: 117/61  Pulse: 50  Temp: 36.1 C  Resp: 15    Post vital signs: Reviewed and stable  Level of consciousness: sedated  Complications: No apparent anesthesia complications

## 2013-06-17 NOTE — Op Note (Signed)
NAMEWREN, GALLAGA NO.:  [id number]  MEDICAL RECORD NO.:  [id number]  LOCATION:                               FACILITY:  [facility]  PHYSICIAN:  Metta Clines. Ashvin Adelson, M.D.  DATE OF BIRTH:  10-Nov-1985  DATE OF PROCEDURE:  06/16/2013 DATE OF DISCHARGE:  06/16/2013                              OPERATIVE REPORT   PREOPERATIVE DIAGNOSIS:  Displaced right knee bucket handle medial meniscus tear with locked knee.  POSTOPERATIVE DIAGNOSIS:  Displaced right knee bucket handle medial meniscus tear with locked knee.  PROCEDURE: 1. Right knee diagnostic arthroscopy. 2. Subtotal medial meniscectomy.  SURGEON:  Metta Clines. Deronda Christian  ASSISTANT:  Reather Laurence. Shuford, P.A.-C.  ANESTHESIA:  LMA general as well as local.  TOURNIQUET TIME:  Not used.  ESTIMATED BLOOD LOSS:  Minimal.  DRAINS:  None.  HISTORY:  Mr. Grafton is a 28 year old gentleman who has had a previous ACL reconstruction that was performed back in 2009 and done very well until a recent re-injury to the right knee while playing racquetball several weeks ago.  Since that time, he has been unable to fully extend the knee with the a medial joint line prominence, local medial tenderness and MRI scan showing a displaced bucket-handle tear of the medial meniscus.  Due to his persistent pain and mechanical symptoms of locked knee, he was brought to the operative room.  At this time, we planned for right knee arthroscopy with debridement versus repair.  Preoperatively, I counseled Mr. Meenach on treatment options as well as risks versus benefits thereof.  Possible surgical complications were reviewed including potential for bleeding, infection, neurovascular injury, DVT, PE as well as persistent pain.  He understands and accepts and agrees to the planned procedure.  PROCEDURE IN DETAIL:  After undergoing routine preop evaluation, the patient received prophylactic antibiotics and brought to the operating room, placed supine  on the operating table, underwent smooth induction of an LMA general anesthesia.  The right leg was placed in a leg holder and sterilely prepped and draped in standard fashion.  Time-out was called.  Standard arthroscopy portals were established and diagnostic arthroscopy was performed.  The suprapatellar pouch and gutters were clear of loose bodies.  Patellofemoral joint showed normal articular cartilage and normal patellar tracking.  The intercondylar notch showed the ACL graft nicely revascularized, reconstituted with negative __________.  PCL intact.  There was an obvious displaced bucket handle tear of the medial meniscus, which traversed through predominantly an avascular zone and due to the degree of maceration and degeneration of the segment, it did not appear to have appropriate healing potential for repairs.  We proceeded with a partial medial meniscectomy, amputating at its midpoint and then using a combination of basket and shaver to debride the residual portion of the meniscus back to its margins at the periphery of the meniscus.  I then used a basket to further trim the peripheral margin of the meniscus to a stable and healthy tissue and these were __________ final contouring and removal of the meniscal fragments.  I would estimate that 70% of the meniscus was excised with a __________ remaining intact about the entirety of the meniscal region.  We then used the Arthrex wand to obtain hemostasis.  Articular surfaces were found to be in good condition in the medial compartment. Laterally, meniscus was intact and articular surfaces were all in excellent condition.  At this point, final inspection and irrigation were then completed. __________ removed.  Combination of Marcaine and morphine was instilled in the knee joint and additional Marcaine about the portals.  Portals were closed with Steri-Strips.  Dry dressings were wrapped up the knee.  Leg was wrapped with Ace bandage and  thigh support stocking.  The patient was awakened, extubated, and taken to recovery room in stable condition.     Metta Clines. Trentan Trippe, M.D.     KMS/MEDQ  D:  06/16/2013  T:  06/16/2013  Job:  [id number]

## 2013-06-17 NOTE — Op Note (Deleted)
NAMEGARLAN, Glen NO.:  [id number]  MEDICAL RECORD NO.:  [id number]  LOCATION:                               FACILITY:  [facility]  PHYSICIAN:  Metta Clines. Nicholad Kautzman, M.D.  DATE OF BIRTH:  April 17, 1985  DATE OF PROCEDURE:  06/16/2013 DATE OF DISCHARGE:  06/16/2013                              OPERATIVE REPORT   PREOPERATIVE DIAGNOSIS:  Displaced right knee bucket handle medial meniscus tear with locked knee.  POSTOPERATIVE DIAGNOSIS:  Displaced right knee bucket handle medial meniscus tear with locked knee.  PROCEDURE: 1. Right knee diagnostic arthroscopy. 2. Subtotal medial meniscectomy.  SURGEON:  Metta Clines. Khalani Novoa  ASSISTANT:  Reather Laurence. Shuford, P.A.-C.  ANESTHESIA:  LMA general as well as local.  TOURNIQUET TIME:  Not used.  ESTIMATED BLOOD LOSS:  Minimal.  DRAINS:  None.  HISTORY:  Glen Cain is a 28 year old gentleman who has had a previous ACL reconstruction that was performed back in 2009 and done very well until a recent re-injury to the right knee while playing racquetball several weeks ago.  Since that time, he has been unable to fully extend the knee with the a medial joint line prominence, local medial tenderness and MRI scan showing a displaced bucket-handle tear of the medial meniscus.  Due to his persistent pain and mechanical symptoms of locked knee, he was brought to the operative room.  At this time, we planned for right knee arthroscopy with debridement versus repair.  Preoperatively, I counseled Glen Cain on treatment options as well as risks versus benefits thereof.  Possible surgical complications were reviewed including potential for bleeding, infection, neurovascular injury, DVT, PE as well as persistent pain.  He understands and accepts and agrees to the planned procedure.  PROCEDURE IN DETAIL:  After undergoing routine preop evaluation, the patient received prophylactic antibiotics and brought to the operating room, placed supine  on the operating table, underwent smooth induction of an LMA general anesthesia.  The right leg was placed in a leg holder and sterilely prepped and draped in standard fashion.  Time-out was called.  Standard arthroscopy portals were established and diagnostic arthroscopy was performed.  The suprapatellar pouch and gutters were clear of loose bodies.  Patellofemoral joint showed normal articular cartilage and normal patellar tracking.  The intercondylar notch showed the ACL graft nicely revascularized, reconstituted with negative __________.  PCL intact.  There was an obvious displaced bucket handle tear of the medial meniscus, which traversed through predominantly an avascular zone and due to the degree of maceration and degeneration of the segment, it did not appear to have appropriate healing potential for repairs.  We proceeded with a partial medial meniscectomy, amputating at its midpoint and then using a combination of basket and shaver to debride the residual portion of the meniscus back to its margins at the periphery of the meniscus.  I then used a basket to further trim the peripheral margin of the meniscus to a stable and healthy tissue and these were __________ final contouring and removal of the meniscal fragments.  I would estimate that 70% of the meniscus was excised with a __________ remaining intact about the entirety of the meniscal region.  We then used the Arthrex wand to obtain hemostasis.  Articular surfaces were found to be in good condition in the medial compartment. Laterally, meniscus was intact and articular surfaces were all in excellent condition.  At this point, final inspection and irrigation were then completed. __________ removed.  Combination of Marcaine and morphine was instilled in the knee joint and additional Marcaine about the portals.  Portals were closed with Steri-Strips.  Dry dressings were wrapped up the knee.  Leg was wrapped with Ace bandage and  thigh support stocking.  The patient was awakened, extubated, and taken to recovery room in stable condition.     Metta Clines. Pelagia Iacobucci, M.D.     KMS/MEDQ  D:  06/16/2013  T:  06/16/2013  Job:  [id number]

## 2013-11-02 ENCOUNTER — Other Ambulatory Visit (INDEPENDENT_AMBULATORY_CARE_PROVIDER_SITE_OTHER): Payer: 59

## 2013-11-02 DIAGNOSIS — Z0000 Encounter for general adult medical examination without abnormal findings: Secondary | ICD-10-CM

## 2013-11-02 LAB — CBC WITH DIFFERENTIAL/PLATELET
BASOS ABS: 0 10*3/uL (ref 0.0–0.1)
Basophils Relative: 0.4 % (ref 0.0–3.0)
Eosinophils Absolute: 0.1 10*3/uL (ref 0.0–0.7)
Eosinophils Relative: 2 % (ref 0.0–5.0)
HCT: 45.5 % (ref 39.0–52.0)
HEMOGLOBIN: 15.5 g/dL (ref 13.0–17.0)
LYMPHS PCT: 22.9 % (ref 12.0–46.0)
Lymphs Abs: 1.7 10*3/uL (ref 0.7–4.0)
MCHC: 34 g/dL (ref 30.0–36.0)
MCV: 91.6 fl (ref 78.0–100.0)
MONOS PCT: 9.1 % (ref 3.0–12.0)
Monocytes Absolute: 0.7 10*3/uL (ref 0.1–1.0)
NEUTROS ABS: 4.8 10*3/uL (ref 1.4–7.7)
Neutrophils Relative %: 65.6 % (ref 43.0–77.0)
Platelets: 203 10*3/uL (ref 150.0–400.0)
RBC: 4.97 Mil/uL (ref 4.22–5.81)
RDW: 13.2 % (ref 11.5–15.5)
WBC: 7.3 10*3/uL (ref 4.0–10.5)

## 2013-11-02 LAB — LIPID PANEL
CHOL/HDL RATIO: 4
Cholesterol: 150 mg/dL (ref 0–200)
HDL: 37.5 mg/dL — AB (ref >39.00)
LDL CALC: 90 mg/dL (ref 0–99)
NonHDL: 112.5
TRIGLYCERIDES: 115 mg/dL (ref 0.0–149.0)
VLDL: 23 mg/dL (ref 0.0–40.0)

## 2013-11-02 LAB — POCT URINALYSIS DIPSTICK
BILIRUBIN UA: NEGATIVE
GLUCOSE UA: NEGATIVE
Ketones, UA: NEGATIVE
Leukocytes, UA: NEGATIVE
Nitrite, UA: NEGATIVE
Protein, UA: NEGATIVE
RBC UA: NEGATIVE
Spec Grav, UA: 1.01
UROBILINOGEN UA: 0.2
pH, UA: 7

## 2013-11-02 LAB — BASIC METABOLIC PANEL
BUN: 13 mg/dL (ref 6–23)
CALCIUM: 9.2 mg/dL (ref 8.4–10.5)
CO2: 25 mEq/L (ref 19–32)
Chloride: 103 mEq/L (ref 96–112)
Creatinine, Ser: 1 mg/dL (ref 0.4–1.5)
GFR: 95.37 mL/min (ref >60.00)
GLUCOSE: 76 mg/dL (ref 70–99)
Potassium: 4 mEq/L (ref 3.5–5.1)
SODIUM: 138 meq/L (ref 135–145)

## 2013-11-02 LAB — TSH: TSH: 0.77 u[IU]/mL (ref 0.35–4.50)

## 2013-11-02 LAB — HEPATIC FUNCTION PANEL
ALBUMIN: 4.2 g/dL (ref 3.5–5.2)
ALT: 44 U/L (ref 0–53)
AST: 32 U/L (ref 0–37)
Alkaline Phosphatase: 72 U/L (ref 39–117)
Bilirubin, Direct: 0 mg/dL (ref 0.0–0.3)
Total Bilirubin: 0.8 mg/dL (ref 0.2–1.2)
Total Protein: 7.3 g/dL (ref 6.0–8.3)

## 2013-11-09 ENCOUNTER — Encounter: Payer: Self-pay | Admitting: Family Medicine

## 2013-11-09 ENCOUNTER — Ambulatory Visit (INDEPENDENT_AMBULATORY_CARE_PROVIDER_SITE_OTHER): Payer: 59 | Admitting: Family Medicine

## 2013-11-09 VITALS — BP 126/70 | HR 74 | Temp 98.0°F | Ht 69.0 in | Wt 202.0 lb

## 2013-11-09 DIAGNOSIS — R110 Nausea: Secondary | ICD-10-CM

## 2013-11-09 DIAGNOSIS — Z0000 Encounter for general adult medical examination without abnormal findings: Secondary | ICD-10-CM

## 2013-11-09 NOTE — Patient Instructions (Signed)
Consider over the counter Zantac or Pepcid prior to any fatty or spicy foods.  Lipoma A lipoma is a noncancerous (benign) tumor composed of fat cells. They are usually found under the skin (subcutaneous). A lipoma may occur in any tissue of the body that contains fat. Common areas for lipomas to appear include the back, shoulders, buttocks, and thighs. Lipomas are a very common soft tissue growth. They are soft and grow slowly. Most problems caused by a lipoma depend on where it is growing. DIAGNOSIS  A lipoma can be diagnosed with a physical exam. These tumors rarely become cancerous, but radiographic studies can help determine this for certain. Studies used may include:  Computerized X-ray scans (CT or CAT scan).  Computerized magnetic scans (MRI). TREATMENT  Small lipomas that are not causing problems may be watched. If a lipoma continues to enlarge or causes problems, removal is often the best treatment. Lipomas can also be removed to improve appearance. Surgery is done to remove the fatty cells and the surrounding capsule. Most often, this is done with medicine that numbs the area (local anesthetic). The removed tissue is examined under a microscope to make sure it is not cancerous. Keep all follow-up appointments with your caregiver. SEEK MEDICAL CARE IF:   The lipoma becomes larger or hard.  The lipoma becomes painful, red, or increasingly swollen. These could be signs of infection or a more serious condition. Document Released: 03/07/2002 Document Revised: 06/09/2011 Document Reviewed: 08/17/2009 [facility] Patient Information 2015 [place], [place]. This information is not intended to replace advice given to you by your health care provider. Make sure you discuss any questions you have with your health care provider.

## 2013-11-09 NOTE — Progress Notes (Addendum)
Subjective:    Patient ID: Glen Cain, male    DOB: 06-18-1985, 28 y.o.   MRN: [id number]  HPI  Patient seen for physical examination. He had injury back in March and had knee arthroscopy with meniscal repair. That has gone fairly well. He has no chronic medical problems. He just got married couple weeks ago. Tetanus is up-to-date.  He has had some frequent GI symptoms. He describes frequent postprandial nausea and sometimes vomiting, especially after fatty foods and also sometimes exacerbated with things like alcohol. He is concerned because mother had gallstones.  Denies any recent appetite or weight changes. No stool changes. Denies any significant abdominal pain. No back pain.  No alleviating factors.  Has not tried any antacids.   Past Medical History  Diagnosis Date  . Benign neoplasm of skin, site unspecified 08/22/2009   Past Surgical History  Procedure Laterality Date  . Arthroscopic repair acl Right 07  . Wisdom tooth extraction    . Knee arthroscopy Right 06/16/2013    Procedure: RIGHT ARTHROSCOPY KNEE MENISECTOMY WITH DEBRIDEMENT ;  Surgeon: Marin Shutter, MD;  Location: [facility];  Service: Orthopedics;  Laterality: Right;    reports that he quit smoking about 4 years ago. His smoking use included Cigarettes. He smoked 0.00 packs per day. He does not have any smokeless tobacco history on file. He reports that he drinks about 3.6 ounces of alcohol per week. He reports that he does not use illicit drugs. family history includes Cancer in an other family member; Diabetes in an other family member; Hypertension in an other family member; Stroke in an other family member. No Known Allergies    Review of Systems  Constitutional: Negative for fever, activity change, appetite change and fatigue.  HENT: Negative for congestion, ear pain and trouble swallowing.   Eyes: Negative for pain and visual disturbance.  Respiratory: Negative for cough, shortness of breath and wheezing.     Cardiovascular: Negative for chest pain and palpitations.  Gastrointestinal: Positive for nausea and vomiting. Negative for abdominal pain, diarrhea, constipation, blood in stool, abdominal distention and rectal pain.  Genitourinary: Negative for dysuria, hematuria and testicular pain.  Musculoskeletal: Negative for arthralgias and joint swelling.  Skin: Negative for rash.  Neurological: Negative for dizziness, syncope and headaches.  Hematological: Negative for adenopathy.  Psychiatric/Behavioral: Negative for confusion and dysphoric mood.       Objective:   Physical Exam  Constitutional: He is oriented to person, place, and time. He appears well-developed and well-nourished. No distress.  HENT:  Head: Normocephalic and atraumatic.  Right Ear: External ear normal.  Left Ear: External ear normal.  Mouth/Throat: Oropharynx is clear and moist.  Eyes: Conjunctivae and EOM are normal. Pupils are equal, round, and reactive to light.  Neck: Normal range of motion. Neck supple. No thyromegaly present.  Cardiovascular: Normal rate, regular rhythm and normal heart sounds.   No murmur heard. Pulmonary/Chest: No respiratory distress. He has no wheezes. He has no rales.  Abdominal: Soft. Bowel sounds are normal. He exhibits no distension and no mass. There is no tenderness. There is no rebound and no guarding.  Musculoskeletal: He exhibits no edema.  Lymphadenopathy:    He has no cervical adenopathy.  Neurological: He is alert and oriented to person, place, and time. He displays normal reflexes. No cranial nerve deficit.  Skin: No rash noted.  Psychiatric: He has a normal mood and affect.          Assessment & Plan:  Complete physical.  Labs reviewed with no concerns.  establish more regular exercise.   Tetanus is up to date.  Intermittent postprandial dyspepsia and nausea and vomiting. Patient is concerned about possible gallstones though we feel likelihood is fairly low given his age.  Suspect at least some of this may be related to hyperacidity and reflux. We'll check abdominal ultrasound. Recommend trial of over-the-counter antacids such as Pepcid or Zantac. Consider further GI evaluation with upper GI or endoscopy if symptoms persist with unrevealing ultrasound  Ultrasound reviewed with patient:  "IMPRESSION:  Small gallstone adherent in the neck of the gallbladder. Three  probable small polyps in the gallbladder. Occasional foci of what  appears to be inflammatory change in the gallbladder wall, most  likely indicative of underlying cholesterolosis or adenomyomatosis.  Advise followup of ultrasound of the gallbladder in 4-6 months to  assess for stability of these findings if these findings and  clinical symptoms are not felt to warrant surgical removal of the  gallbladder.  Study otherwise unremarkable"  Will set up surgical referral to further discuss.

## 2013-11-09 NOTE — Progress Notes (Signed)
Pre visit review using our clinic review tool, if applicable. No additional management support is needed unless otherwise documented below in the visit note. 

## 2013-12-12 ENCOUNTER — Other Ambulatory Visit: Payer: 59

## 2014-01-09 ENCOUNTER — Ambulatory Visit
Admission: RE | Admit: 2014-01-09 | Discharge: 2014-01-09 | Disposition: A | Discharge status: Home / Self Care | Payer: Managed Care, Other (non HMO) | Source: Ambulatory Visit | Attending: Family Medicine | Admitting: Family Medicine

## 2014-01-09 DIAGNOSIS — R110 Nausea: Secondary | ICD-10-CM

## 2014-01-09 IMAGING — US US ABDOMEN COMPLETE
1 series · 13 of 25 positions shown · non-contrast
Comparison: None.

CLINICAL DATA: Postprandial nausea and vomiting for 8 months

EXAM:
ULTRASOUND ABDOMEN COMPLETE

[Series 1: us abdomen complete · 0.24mm/px · 13 of 96 slices shown]
[im 1/96]
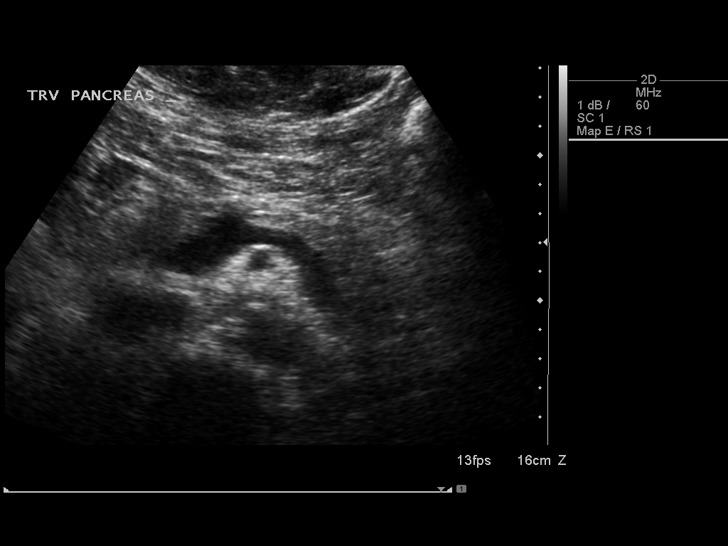
[im 8/96]
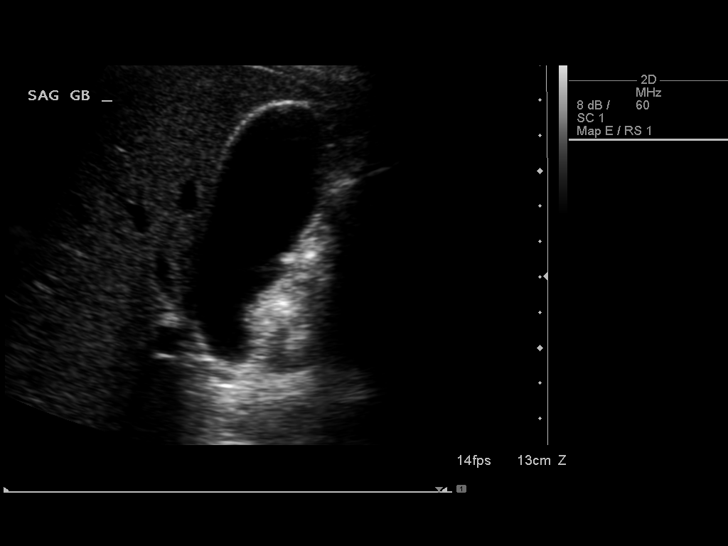
[im 16/96]
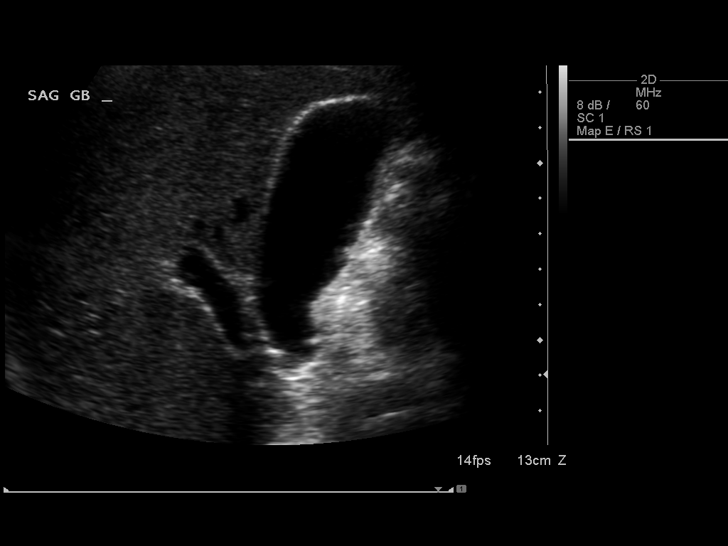
[im 24/96]
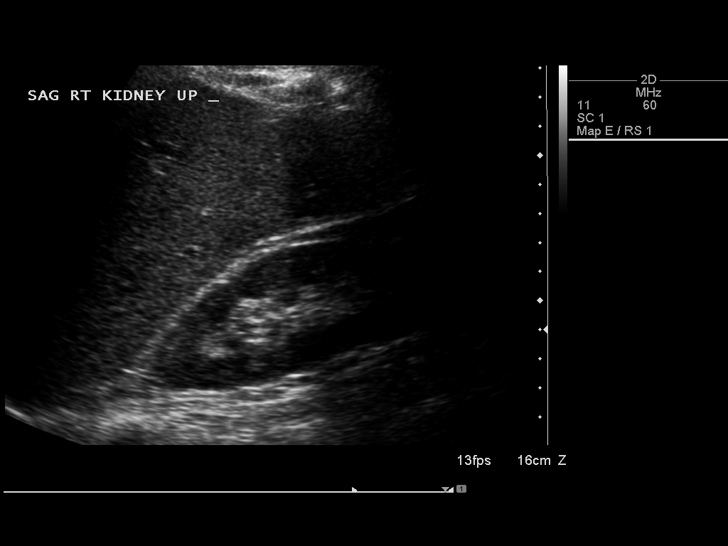
[im 32/96]
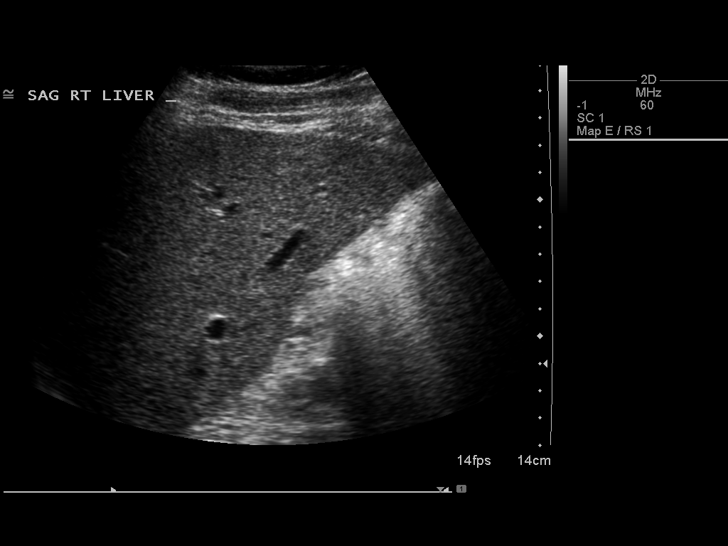
[im 40/96]
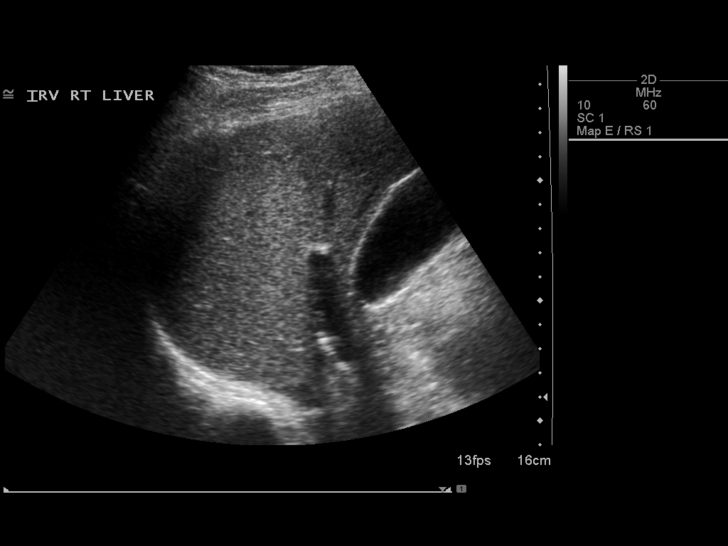
[im 48/96]
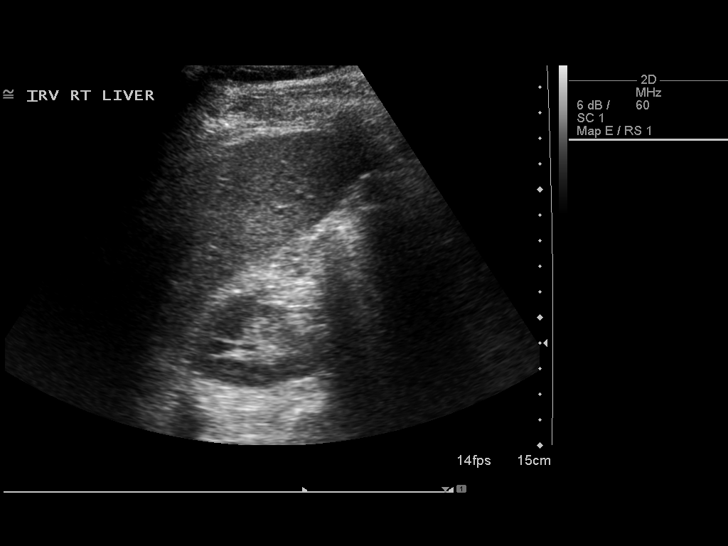
[im 56/96]
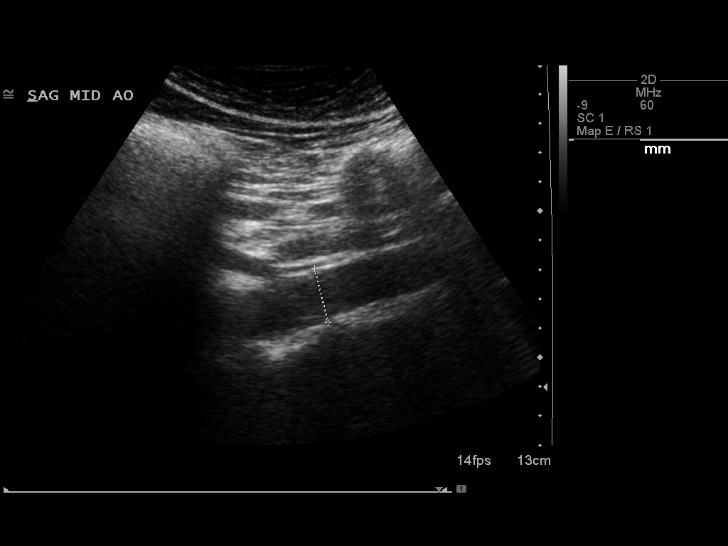
[im 64/96]
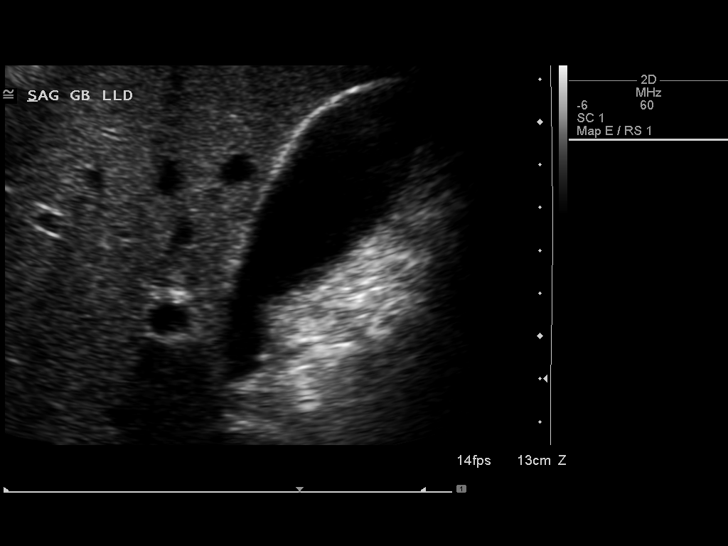
[im 72/96]
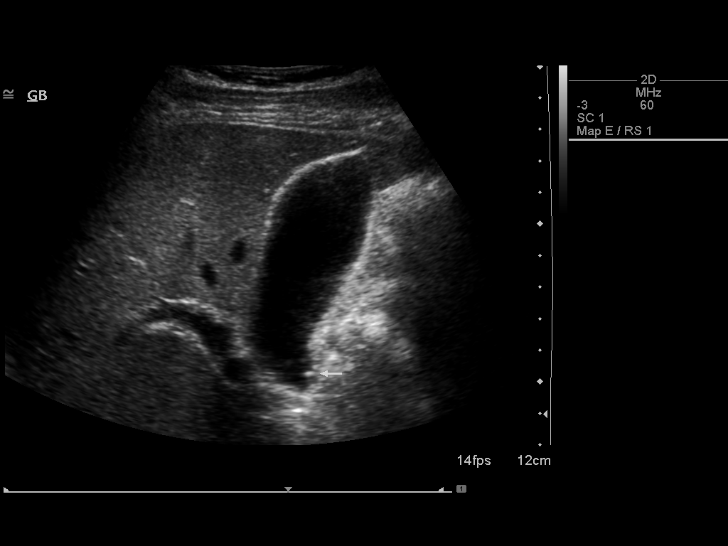
[im 80/96]
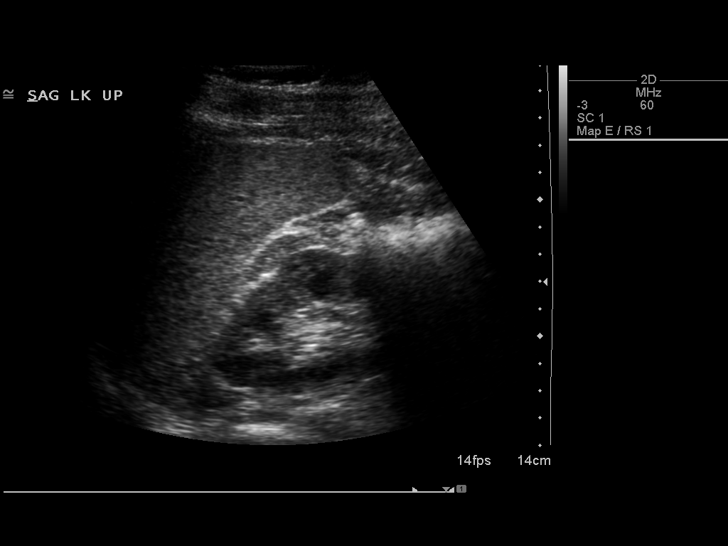
[im 88/96]
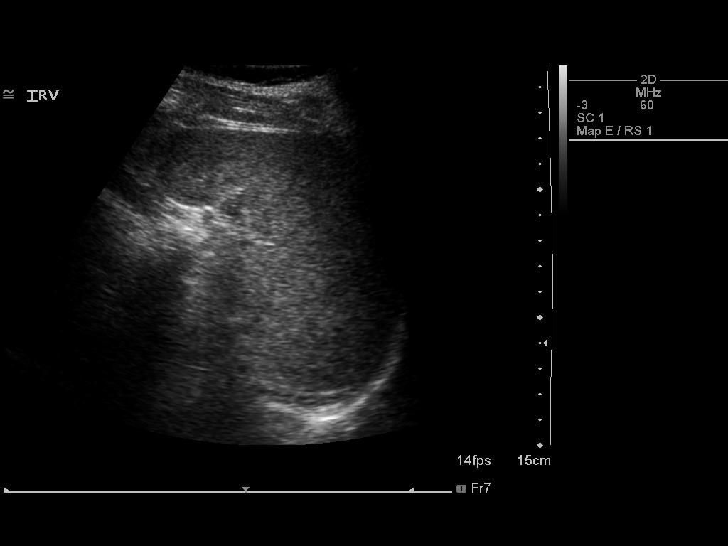
[im 96/96]
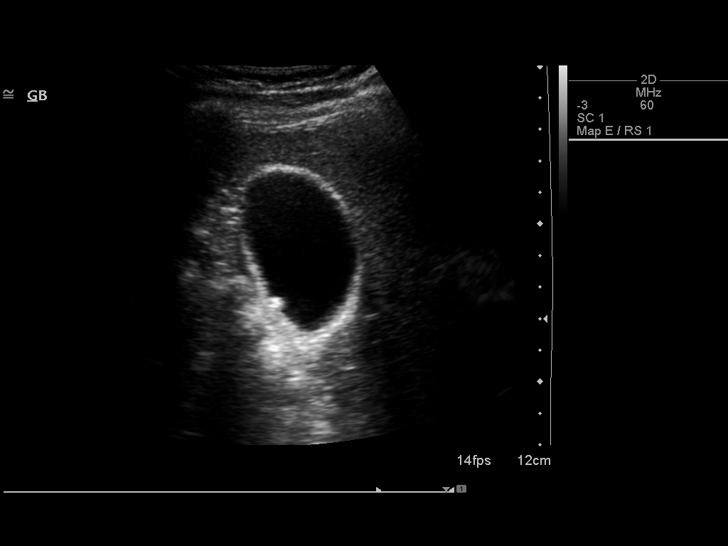

[13 of 25 positions shown; findings below may reference images not displayed]

FINDINGS: Gallbladder: There is a 4 mm echogenic focus in the neck of the
gallbladder which shadows, felt to represent a gallstone adherent in
the neck of the gallbladder. There are 3 non shadowing non mobile
foci along the wall of the gallbladder felt to represent polyps. One
of these foci measures 5 mm in size. A second focus measures 2 mm in
size. The third focus measures 3 mm in size. There are occasional
foci of increased echogenicity along the gallbladder wall which have
an appearance suggesting underlying inflammation such as
cholesterolosis or adenomyomatosis. There is no gallbladder wall
thickening or pericholecystic fluid. No sonographic Murphy sign
noted.

Common bile duct: Diameter: 3 mm. There is no intrahepatic, common
hepatic, or common bile duct dilatation.

Liver: No focal lesion identified. Within normal limits in
parenchymal echogenicity.

IVC: No abnormality visualized.

Pancreas: No mass or inflammatory focus.

Spleen: Size and appearance within normal limits.

Right Kidney: Length: 12.6 cm. Echogenicity within normal limits. No
mass or hydronephrosis visualized.

Left Kidney: Length: 12.6 cm. Echogenicity within normal limits. No
mass or hydronephrosis visualized.

Abdominal aorta: No aneurysm visualized.

Other findings: No demonstrable ascites.
IMPRESSION: Small gallstone adherent in the neck of the gallbladder. Three
probable small polyps in the gallbladder. Occasional foci of what
appears to be inflammatory change in the gallbladder wall, most
likely indicative of underlying cholesterolosis or adenomyomatosis.
Advise followup of ultrasound of the gallbladder in 4-6 months to
assess for stability of these findings if these findings and
clinical symptoms are not felt to warrant surgical removal of the
gallbladder.

Study otherwise unremarkable.

## 2014-01-09 NOTE — Addendum Note (Signed)
Addended by: Eulas Post on: 01/09/2014 12:17 PM   Modules accepted: Orders

## 2014-01-31 ENCOUNTER — Encounter (INDEPENDENT_AMBULATORY_CARE_PROVIDER_SITE_OTHER): Payer: Self-pay | Admitting: General Surgery

## 2014-01-31 NOTE — Progress Notes (Unsigned)
Patient ID: Glen Cain, male   DOB: Jan 14, 1986, 28 y.o.   MRN: [id number]  Glen Cain 01/31/2014 3:00 PM Location: [facility] Patient #: [id number] DOB: 01/16/1986 Married / Language: Glen Cain / Race: White Male History of Present Illness Glen Hollingshead MD; 01/31/2014 3:42 PM) Patient words: Discuss options for gallbladder.  The patient is a 28 year old male    Note:He is referred by Dr. Elease Hashimoto because of upper bowel discomfort, bloating, nausea and vomiting especially after eating a fatty meal. He has had intermittent episodes for about a year. He was recently in [place] and had a fairly significant episode. There is a strong family history of gallbladder disease. Ultrasound demonstrated a small gallstone in neck of the gallbladder. There were also 3 polyps the largest being 5 mm and one area of thickening of the gallbladder wall. He also has some diarrhea. He has not tried any antacids for this. He is sent to [place] to discuss cholecystectomy. He is here with his wife.  Other Problems Glen Cottingham, LPN; 86/09/6718 9:47 PM) Cholelithiasis  Past Surgical History Glen Crystal, LPN; 12/05/2834 6:29 PM) Knee Surgery Right.  Diagnostic Studies History Glen Kozikowski, LPN; 47/08/5463 0:35 PM) Colonoscopy never  Allergies Glen Holts, LPN; 46/07/6810 7:51 PM) No Known Drug Allergies 01/31/2014  Medication History Glen Perusse, LPN; 70/0/1749 4:49 PM) Multivitamins (Oral) Active.  Social History Glen Gottwald, LPN; 67/07/9161 8:46 PM) Alcohol use Moderate alcohol use. Caffeine use Carbonated beverages, Tea. No drug use Tobacco use Former smoker.  Family History Glen Markman, LPN; 65/11/9355 0:17 PM) Hypertension Father.     Review of Systems Glen Stfort LPN; 79/05/9028 0:92 PM) General Not Present- Appetite Loss, Chills, Fatigue, Fever, Night Sweats, Weight Gain and Weight Loss. Skin Not Present- Change in Wart/Mole, Dryness, Hives,  Jaundice, New Lesions, Non-Healing Wounds, Rash and Ulcer. HEENT Present- Sore Throat. Not Present- Earache, Hearing Loss, Hoarseness, Nose Bleed, Oral Ulcers, Ringing in the Ears, Seasonal Allergies, Sinus Pain, Visual Disturbances, Wears glasses/contact lenses and Yellow Eyes. Respiratory Not Present- Bloody sputum, Chronic Cough, Difficulty Breathing, Snoring and Wheezing. Breast Not Present- Breast Mass, Breast Pain, Nipple Discharge and Skin Changes. Cardiovascular Not Present- Chest Pain, Difficulty Breathing Lying Down, Leg Cramps, Palpitations, Rapid Heart Rate, Shortness of Breath and Swelling of Extremities. Gastrointestinal Present- Change in Bowel Habits, Indigestion, Nausea and Vomiting. Not Present- Abdominal Pain, Bloating, Bloody Stool, Chronic diarrhea, Constipation, Difficulty Swallowing, Excessive gas, Gets full quickly at meals, Hemorrhoids and Rectal Pain. Musculoskeletal Not Present- Back Pain, Joint Pain, Joint Stiffness, Muscle Pain, Muscle Weakness and Swelling of Extremities. Neurological Not Present- Decreased Memory, Fainting, Headaches, Numbness, Seizures, Tingling, Tremor, Trouble walking and Weakness. Psychiatric Not Present- Anxiety, Bipolar, Change in Sleep Pattern, Depression, Fearful and Frequent crying. Endocrine Not Present- Cold Intolerance, Excessive Hunger, Hair Changes, Heat Intolerance, Hot flashes and New Diabetes. Hematology Not Present- Easy Bruising, Excessive bleeding, Gland problems, HIV and Persistent Infections.  Vitals Glen Dillenbeck LPN; 33/0/0762 2:63 PM) 01/31/2014 3:01 PM Weight: 206.25 lb Height: 70in Body Surface Area: 2.15 m Body Mass Index: 29.59 kg/m Temp.: 98.74F(Temporal)  Pulse: 72 (Regular)  Resp.: 22 (Unlabored)  BP: 132/90 (Sitting, Left Arm, Standard)     Physical Exam Glen Hollingshead MD; 01/31/2014 3:40 PM)  The physical exam findings are as follows: Note:General: WDWN in NAD. Pleasant and  cooperative.  HEENT: Glen Cain/AT, no facial masses  EYES: EOMI, no icterus  CV: RRR, no murmur, no JVD.  CHEST: Breath sounds equal and clear. Respirations nonlabored.  ABDOMEN: Soft, nontender, nondistended, no masses, no organomegaly, active bowel sounds, no scars, no hernias.  NEUROLOGIC: Alert and oriented, answers questions appropriately, normal gait and station.  PSYCHIATRIC: Normal mood, affect , and behavior.    Assessment & Plan Glen Hollingshead MD; 01/31/2014 3:42 PM)  SYMPTOMATIC CHOLELITHIASIS (574.20  K80.20) Impression: His symptoms are concerning for symptomatic cholelithiasis. Other possible causes include gastroesophageal reflux or irritable bowel syndrome. He could have an extensive some of the symptoms. His gallbladder is abnormal, so for this reason alone I would recommend removal. I did discuss with him that it may not completely resolve all his symptoms. He is interested in proceeding.  Plan: Laparoscopic cholecystectomy with cholangiogram. I have explained the procedure, risks, and aftercare of cholecystectomy. Risks include but are not limited to bleeding, infection, wound problems, anesthesia, diarrhea, bile leak, injury to common bile duct/liver/intestine. He seems to understand and agrees to proceed.  Glen Cain

## 2014-02-28 HISTORY — PX: CHOLECYSTECTOMY: SHX55

## 2014-03-09 ENCOUNTER — Other Ambulatory Visit (INDEPENDENT_AMBULATORY_CARE_PROVIDER_SITE_OTHER): Payer: Self-pay | Admitting: General Surgery

## 2014-03-20 ENCOUNTER — Telehealth (INDEPENDENT_AMBULATORY_CARE_PROVIDER_SITE_OTHER): Payer: Self-pay

## 2014-03-20 NOTE — Telephone Encounter (Signed)
Pt called and was given benign pathology results.

## 2014-03-20 NOTE — Telephone Encounter (Signed)
Noted  

## 2015-04-04 ENCOUNTER — Other Ambulatory Visit (INDEPENDENT_AMBULATORY_CARE_PROVIDER_SITE_OTHER): Payer: BLUE CROSS/BLUE SHIELD

## 2015-04-04 DIAGNOSIS — Z0000 Encounter for general adult medical examination without abnormal findings: Secondary | ICD-10-CM | POA: Diagnosis not present

## 2015-04-04 LAB — CBC WITH DIFFERENTIAL/PLATELET
Basophils Absolute: 0 10*3/uL (ref 0.0–0.1)
Basophils Relative: 0.3 % (ref 0.0–3.0)
EOS PCT: 2.4 % (ref 0.0–5.0)
Eosinophils Absolute: 0.2 10*3/uL (ref 0.0–0.7)
HCT: 47.4 % (ref 39.0–52.0)
Hemoglobin: 15.9 g/dL (ref 13.0–17.0)
LYMPHS ABS: 2.2 10*3/uL (ref 0.7–4.0)
Lymphocytes Relative: 24.6 % (ref 12.0–46.0)
MCHC: 33.6 g/dL (ref 30.0–36.0)
MCV: 90.9 fl (ref 78.0–100.0)
MONOS PCT: 8.3 % (ref 3.0–12.0)
Monocytes Absolute: 0.7 10*3/uL (ref 0.1–1.0)
NEUTROS ABS: 5.8 10*3/uL (ref 1.4–7.7)
NEUTROS PCT: 64.4 % (ref 43.0–77.0)
PLATELETS: 230 10*3/uL (ref 150.0–400.0)
RBC: 5.22 Mil/uL (ref 4.22–5.81)
RDW: 13 % (ref 11.5–15.5)
WBC: 9 10*3/uL (ref 4.0–10.5)

## 2015-04-04 LAB — BASIC METABOLIC PANEL
BUN: 18 mg/dL (ref 6–23)
CO2: 28 meq/L (ref 19–32)
Calcium: 9.6 mg/dL (ref 8.4–10.5)
Chloride: 103 mEq/L (ref 96–112)
Creatinine, Ser: 0.95 mg/dL (ref 0.40–1.50)
GFR: 97.93 mL/min (ref >60.00)
GLUCOSE: 86 mg/dL (ref 70–99)
POTASSIUM: 4 meq/L (ref 3.5–5.1)
SODIUM: 138 meq/L (ref 135–145)

## 2015-04-04 LAB — LIPID PANEL
CHOLESTEROL: 156 mg/dL (ref 0–200)
HDL: 36.7 mg/dL — AB (ref >39.00)
LDL Cholesterol: 104 mg/dL — ABNORMAL HIGH (ref 0–99)
NonHDL: 118.87
TRIGLYCERIDES: 74 mg/dL (ref 0.0–149.0)
Total CHOL/HDL Ratio: 4
VLDL: 14.8 mg/dL (ref 0.0–40.0)

## 2015-04-04 LAB — HEPATIC FUNCTION PANEL
ALBUMIN: 4.4 g/dL (ref 3.5–5.2)
ALT: 19 U/L (ref 0–53)
AST: 18 U/L (ref 0–37)
Alkaline Phosphatase: 69 U/L (ref 39–117)
Bilirubin, Direct: 0.1 mg/dL (ref 0.0–0.3)
Total Bilirubin: 0.7 mg/dL (ref 0.2–1.2)
Total Protein: 7.4 g/dL (ref 6.0–8.3)

## 2015-04-04 LAB — TSH: TSH: 1.23 u[IU]/mL (ref 0.35–4.50)

## 2015-04-09 ENCOUNTER — Encounter: Payer: Managed Care, Other (non HMO) | Admitting: Family Medicine

## 2015-04-23 ENCOUNTER — Encounter: Payer: Self-pay | Admitting: Family Medicine

## 2015-04-23 ENCOUNTER — Ambulatory Visit (INDEPENDENT_AMBULATORY_CARE_PROVIDER_SITE_OTHER): Payer: BLUE CROSS/BLUE SHIELD | Admitting: Family Medicine

## 2015-04-23 VITALS — BP 118/84 | HR 76 | Temp 98.1°F | Ht 69.0 in | Wt 213.9 lb

## 2015-04-23 DIAGNOSIS — Z0000 Encounter for general adult medical examination without abnormal findings: Secondary | ICD-10-CM | POA: Diagnosis not present

## 2015-04-23 NOTE — Patient Instructions (Signed)
Zika Virus Disease Prevention Zika virus disease, or Zika, is an illness that can spread to people from mosquitoes that carry the virus. It may also spread from person to person through infected body fluids. Zika first occurred in [place], but recently it has spread to new areas. The virus occurs in tropical climates. The location of Zika continues to change. Most people who become infected with Zika virus do not develop serious illness. Zika may cause birth defects in an unborn baby whose mother is infected with the virus. It may also increase the risk of miscarriage. HOW DOES ZIKA VIRUS DISEASE SPREAD? The main way that [place] virus spreads is through the bite of a certain type of mosquito. Unlike most types of mosquitos, which bite only at night, the type of mosquito that carries Zika virus bites both at night and during the day. Zika virus can also spread through sexual contact, through a blood transfusion, and from a mother to her baby before or during birth. Once you have had Zika virus disease, it is unlikely that you will get it again. WHAT ARE THE SYMPTOMS OF ZIKA VIRUS DISEASE? In many cases, people who have been infected with Zika virus do not develop any symptoms. If symptoms appear, they usually start about a week after the person is infected. Symptoms are usually mild. They may include:  Fever.  Rash.  Red eyes.  Joint pain. A woman who is infected with Zika virus while pregnant is at risk of having her baby born with a condition in which the brain or head is smaller than expected (microcephaly). Babies who have microcephaly can have developmental delays, seizures, hearing problems, and vision problems. In rare cases, a person can develop a disorder called Guillain-Barre syndrome after having a viral illness such as Zika. Symptoms of this disorder include weakness in the arms, legs, or face. HOW DO HEALTH CARE PROVIDERS DIAGNOSE ZIKA VIRUS DISEASE? A sample of blood can be tested for  Zika virus. HOW CAN ZIKA VIRUS DISEASE BE PREVENTED? There is no vaccine to prevent Zika. The best way to prevent the disease is to avoid infected mosquitoes and avoid exposure to body fluids that can spread the virus. Prevention is important for everyone. Because of the risk to unborn babies, taking precautions is especially important for pregnant women, women who are trying to get pregnant, and sexual partners of these women. WHAT SHOULD I KNOW ABOUT TRAVELING?  The locations where [place] is being reported change often. To identify high-risk areas, check the CDC travel website: CreditChaos.com.ee  Take all precautions to avoid mosquito bites if you live in, or travel to, any of the high-risk areas.  Pregnant women, women who are trying to get pregnant, and sexual partners of these women should talk with their health care providers before and after traveling to a high-risk area.  When you return from traveling to any high-risk area, continue taking actions to protect yourself against mosquito bites for 3 weeks, even if you show no signs of illness. This will prevent spreading Zika virus to uninfected mosquitoes. WHAT STEPS SHOULD I TAKE TO AVOID MOSQUITO BITES? Take these steps to avoid mosquito bites when you are in a high-risk area:  Wear loose clothing that covers your arms and legs.  Limit your outdoor activities.  Do not open windows unless they have window screens.  Sleep under mosquito nets.  Use insect repellent. The best insect repellents have:  DEET, picaridin, oil of lemon eucalyptus (OLE), or IR3535 in them.  Higher amounts of an active ingredient in them.  Remember that insect repellents are safe to use during pregnancy.  Do not use OLE on children who are younger than 80 years of age. Do not use insect repellent on babies who are younger than 41 months of age.  Cover your child's stroller with mosquito netting. Make sure netting fits snugly and that any loose  netting does not cover your child's mouth or nose. Do not use a blanket as a mosquito-protection cover.  Do not apply insect repellent underneath clothing.  If you are using sunscreen, apply the sunscreen before applying the insect repellent.  Treat clothing with permethrin. Do not apply permethrin directly to your skin. Follow label directions for safe use.  Get rid of standing water, where mosquitoes may reproduce. Standing water is often found in items such as buckets, bowls, animal food dishes, and flowerpots. WHAT SHOULD I KNOW ABOUT DONATING BLOOD? To prevent Zika from passing through blood, wait to donate blood until:  4 weeks after you have traveled to an area where [place] has been reported.  4 weeks after you have had symptoms of Zika.  4 weeks after you have had sexual contact with:  A person who has had Zika.  A person who has traveled to a high-risk area at any time during the 3 months before the sexual contact occurred. You must wait this long because Zika virus can live longer in semen than it can live in blood. WHAT SHOULD I KNOW ABOUT THE SEXUAL TRANSMISSION OF ZIKA? People can spread Zika to their sexual partners during vaginal, anal, or oral sex, or by sharing sexual devices. Many people with [place] do not develop symptoms, so a person could spread the disease without knowing that they are infected. The greatest risk is to women who are pregnant or who may become pregnant. Couples can prevent sexual transmission of the virus by:  Using condoms correctly during the entire duration of sexual activity, every time. This includes vaginal, anal, and oral sex.  Not sharing sexual devices. Sharing increases your risk of being exposed to body fluid from another person.  Avoiding all sexual activity until your health care provider says it is safe. These precautions are especially important for couples with a sexual partner who has traveled to an area with [place] or who has symptoms of  Zika. Talk with your health care provider about your risk.   This information is not intended to replace advice given to you by your health care provider. Make sure you discuss any questions you have with your health care provider.   Document Released: 08/01/2014 Document Revised: 12/06/2014 Document Reviewed: 11/29/2014 Elsevier Interactive Patient Education [employer].

## 2015-04-23 NOTE — Progress Notes (Signed)
Pre visit review using our clinic review tool, if applicable. No additional management support is needed unless otherwise documented below in the visit note. 

## 2015-04-23 NOTE — Progress Notes (Signed)
   Subjective:    Patient ID: Glen Cain, male    DOB: 06/12/1985, 30 y.o.   MRN: [id number]  HPI Patient seen for physical. He had gallbladder out last year and has not had any recent abdominal issues. Takes no medications. Tetanus up-to-date. He just [place] resort. He did have a couple mosquito bites. He has not had any arthralgias, fever, skin rash, or other concerns. He had several questions regarding Zika virus  Past Medical History  Diagnosis Date  . Benign neoplasm of skin, site unspecified 08/22/2009   Past Surgical History  Procedure Laterality Date  . Arthroscopic repair acl Right 07  . Wisdom tooth extraction    . Knee arthroscopy Right 06/16/2013    Procedure: RIGHT ARTHROSCOPY KNEE MENISECTOMY WITH DEBRIDEMENT ;  Surgeon: Marin Shutter, MD;  Location: [facility];  Service: Orthopedics;  Laterality: Right;  . Cholecystectomy  12/15    reports that he quit smoking about 5 years ago. His smoking use included Cigarettes. He does not have any smokeless tobacco history on file. He reports that he drinks about 3.6 oz of alcohol per week. He reports that he does not use illicit drugs. family history is not on file. No Known Allergies    Review of Systems  Constitutional: Negative for fever, activity change, appetite change and fatigue.  HENT: Negative for congestion, ear pain and trouble swallowing.   Eyes: Negative for pain and visual disturbance.  Respiratory: Negative for cough, shortness of breath and wheezing.   Cardiovascular: Negative for chest pain and palpitations.  Gastrointestinal: Negative for nausea, vomiting, abdominal pain, diarrhea, constipation, blood in stool, abdominal distention and rectal pain.  Genitourinary: Negative for dysuria, hematuria and testicular pain.  Musculoskeletal: Negative for joint swelling and arthralgias.  Skin: Negative for rash.  Neurological: Negative for dizziness, syncope and headaches.    Hematological: Negative for adenopathy.  Psychiatric/Behavioral: Negative for confusion and dysphoric mood.       Objective:   Physical Exam  Constitutional: He is oriented to person, place, and time. He appears well-developed and well-nourished. No distress.  HENT:  Head: Normocephalic and atraumatic.  Right Ear: External ear normal.  Left Ear: External ear normal.  Mouth/Throat: Oropharynx is clear and moist.  Eyes: Conjunctivae and EOM are normal. Pupils are equal, round, and reactive to light.  Neck: Normal range of motion. Neck supple. No thyromegaly present.  Cardiovascular: Normal rate, regular rhythm and normal heart sounds.   No murmur heard. Pulmonary/Chest: No respiratory distress. He has no wheezes. He has no rales.  Abdominal: Soft. Bowel sounds are normal. He exhibits no distension and no mass. There is no tenderness. There is no rebound and no guarding.  Musculoskeletal: He exhibits no edema.  Lymphadenopathy:    He has no cervical adenopathy.  Neurological: He is alert and oriented to person, place, and time. He displays normal reflexes. No cranial nerve deficit.  Skin: No rash noted.  Psychiatric: He has a normal mood and affect.          Assessment & Plan:  Physical exam. Labs reviewed. No major concerns. We strongly advise that he establish more consistent exercise. Discussed weight control strategies Handout given on Zika virus. He does not have any concerning symptoms. Follow-up for any rash, arthralgias, fever

## 2015-12-12 DIAGNOSIS — Z23 Encounter for immunization: Secondary | ICD-10-CM | POA: Diagnosis not present

## 2016-02-01 ENCOUNTER — Encounter: Payer: Self-pay | Admitting: *Deleted

## 2016-02-01 ENCOUNTER — Emergency Department (INDEPENDENT_AMBULATORY_CARE_PROVIDER_SITE_OTHER)
Admission: EM | Admit: 2016-02-01 | Discharge: 2016-02-01 | Disposition: A | Discharge status: Home / Self Care | Payer: BLUE CROSS/BLUE SHIELD | Source: Home / Self Care | Attending: Family Medicine | Admitting: Family Medicine

## 2016-02-01 DIAGNOSIS — J069 Acute upper respiratory infection, unspecified: Secondary | ICD-10-CM | POA: Diagnosis not present

## 2016-02-01 DIAGNOSIS — B9789 Other viral agents as the cause of diseases classified elsewhere: Secondary | ICD-10-CM | POA: Diagnosis not present

## 2016-02-01 MED ORDER — AZITHROMYCIN 250 MG PO TABS: 250.0000 mg | ORAL_TABLET | Freq: Every day | ORAL | 0 refills | Status: DC

## 2016-02-01 MED ORDER — BENZONATATE 100 MG PO CAPS: 100.0000 mg | ORAL_CAPSULE | Freq: Three times a day (TID) | ORAL | 0 refills | Status: DC

## 2016-02-01 NOTE — ED Provider Notes (Signed)
CSN: [id number]     Arrival date & time 02/01/16  0626 History   First MD Initiated Contact with Patient 02/01/16 [phone]     Chief Complaint  Patient presents with  . Cough   (Consider location/radiation/quality/duration/timing/severity/associated sxs/prior Treatment) HPI CHARLE Cain is a 30 y.o. male presenting to UC with c/o productive cough with green sputum at times for about 4 days.  Associated chills Cain nasal congestion.  He has been taking Dayquil Cain Nyquild with mild relief. Pt notes he feels well during the day but feels like he cannot breath due to the congestion at night.  Denies chest pain or SOB at this time. Denies hx of asthma. No sick contacts.    Past Medical History:  Diagnosis Date  . Benign neoplasm of skin, site unspecified 08/22/2009   Past Surgical History:  Procedure Laterality Date  . ARTHROSCOPIC REPAIR ACL Right 07  . CHOLECYSTECTOMY  12/15  . KNEE ARTHROSCOPY Right 06/16/2013   Procedure: RIGHT ARTHROSCOPY KNEE MENISECTOMY WITH DEBRIDEMENT ;  Surgeon: Marin Shutter, MD;  Location: [place];  Service: Orthopedics;  Laterality: Right;  . WISDOM TOOTH EXTRACTION     Family History  Problem Relation Age of Onset  . Diabetes      fhx  . Hypertension      fhx  . Cancer      fhx  . Stroke      fhx   Social History  Substance Use Topics  . Smoking status: Former Smoker    Types: Cigarettes    Quit date: 06/14/2009  . Smokeless tobacco: Never Used     Comment: occ  . Alcohol use 3.6 oz/week    6 Cans of beer per week    Review of Systems  Constitutional: Positive for chills. Negative for fever.  HENT: Positive for congestion, rhinorrhea Cain sore throat. Negative for ear pain, trouble swallowing Cain voice change.   Respiratory: Positive for cough. Negative for shortness of breath.   Cardiovascular: Negative for chest pain Cain palpitations.  Gastrointestinal: Negative for abdominal pain, diarrhea, nausea Cain vomiting.  Musculoskeletal: Negative for  arthralgias, back pain Cain myalgias.  Skin: Negative for rash.  Neurological: Negative for dizziness, light-headedness Cain headaches.    Allergies  Review of patient's allergies indicates no known allergies.  Home Medications   Prior to Admission medications   Medication Sig Start Date End Date Taking? Authorizing Provider  azithromycin (ZITHROMAX) 250 MG tablet Take 1 tablet (250 mg total) by mouth daily. Take first 2 tablets together, then 1 every day until finished. 02/01/16   Noland Fordyce, PA-C  benzonatate (TESSALON) 100 MG capsule Take 1-2 capsules (100-200 mg total) by mouth every 8 (eight) hours. 02/01/16   Noland Fordyce, PA-C  Multiple Vitamin (MULTIVITAMIN) tablet Take 1 tablet by mouth daily.    Historical Provider, MD   Meds Ordered Cain Administered this Visit  Medications - No data to display  BP 127/83 (BP Location: Left Arm)   Temp 98.1 F (36.7 C)   Resp 14   Ht 5' 10"  (1.778 m)   Wt 212 lb (96.2 kg)   SpO2 98%   BMI 30.42 kg/m  No data found.   Physical Exam  Constitutional: He appears well-developed Cain well-nourished. No distress.  HENT:  Head: Normocephalic Cain atraumatic.  Right Ear: Tympanic membrane normal.  Left Ear: Tympanic membrane normal.  Nose: Mucosal edema present. Right sinus exhibits no maxillary sinus tenderness Cain no frontal sinus tenderness. Left sinus exhibits  no maxillary sinus tenderness Cain no frontal sinus tenderness.  Mouth/Throat: Uvula is midline, oropharynx is Glen Cain moist Cain mucous membranes are normal.  Eyes: Conjunctivae are normal. No scleral icterus.  Neck: Normal range of motion. Neck supple.  Cardiovascular: Normal rate, regular rhythm Cain normal heart sounds.   Pulmonary/Chest: Effort normal Cain breath sounds normal. No respiratory distress. He has no wheezes. He has no rales.  Abdominal: Soft. He exhibits no distension. There is no tenderness.  Musculoskeletal: Normal range of motion.  Neurological: He is alert.   Skin: Skin is warm Cain dry. He is not diaphoretic.  Nursing note Cain vitals reviewed.   Urgent Care Course   Clinical Course    Procedures (including critical care time)  Labs Review Labs Reviewed - No data to display  Imaging Review No results found.  MDM   1. Viral URI with cough    Pt c/o URI symptoms for about 4 days. Cough is worse at night. Lungs: CTAB. Pt is afebrile. Symptoms likely viral in nature.  Rx: Tessalon Prescription to hold with expiration date provided for Azithromycin. Home care instructions provided. Patient verbalized understanding Cain agreement with treatment plan.    Noland Fordyce, PA-C 02/01/16 1046

## 2016-02-01 NOTE — ED Triage Notes (Signed)
Pt c/o 4 days of cough, productive with green sputum at times, chills and congestion. Taken Day/Nyquil. Feels he cannot take a deep breath.

## 2016-08-22 ENCOUNTER — Encounter: Payer: Self-pay | Admitting: Family Medicine

## 2016-08-22 ENCOUNTER — Ambulatory Visit (INDEPENDENT_AMBULATORY_CARE_PROVIDER_SITE_OTHER): Payer: BLUE CROSS/BLUE SHIELD | Admitting: Family Medicine

## 2016-08-22 VITALS — BP 110/80 | HR 68 | Temp 98.3°F | Ht 70.75 in | Wt 212.6 lb

## 2016-08-22 DIAGNOSIS — Z0000 Encounter for general adult medical examination without abnormal findings: Secondary | ICD-10-CM

## 2016-08-22 LAB — LIPID PANEL
CHOL/HDL RATIO: 4
Cholesterol: 139 mg/dL (ref 0–200)
HDL: 38.4 mg/dL — AB (ref >39.00)
LDL CALC: 88 mg/dL (ref 0–99)
NONHDL: 100.83
Triglycerides: 65 mg/dL (ref 0.0–149.0)
VLDL: 13 mg/dL (ref 0.0–40.0)

## 2016-08-22 LAB — HEPATIC FUNCTION PANEL
ALK PHOS: 65 U/L (ref 39–117)
ALT: 27 U/L (ref 0–53)
AST: 20 U/L (ref 0–37)
Albumin: 4.7 g/dL (ref 3.5–5.2)
BILIRUBIN DIRECT: 0.1 mg/dL (ref 0.0–0.3)
BILIRUBIN TOTAL: 0.7 mg/dL (ref 0.2–1.2)
TOTAL PROTEIN: 7.2 g/dL (ref 6.0–8.3)

## 2016-08-22 LAB — CBC WITH DIFFERENTIAL/PLATELET
BASOS ABS: 0 10*3/uL (ref 0.0–0.1)
Basophils Relative: 0.3 % (ref 0.0–3.0)
EOS ABS: 0.2 10*3/uL (ref 0.0–0.7)
Eosinophils Relative: 2.3 % (ref 0.0–5.0)
HCT: 45.8 % (ref 39.0–52.0)
Hemoglobin: 15.9 g/dL (ref 13.0–17.0)
LYMPHS ABS: 1.9 10*3/uL (ref 0.7–4.0)
Lymphocytes Relative: 23.7 % (ref 12.0–46.0)
MCHC: 34.6 g/dL (ref 30.0–36.0)
MCV: 89.8 fl (ref 78.0–100.0)
MONOS PCT: 7.9 % (ref 3.0–12.0)
Monocytes Absolute: 0.6 10*3/uL (ref 0.1–1.0)
NEUTROS PCT: 65.8 % (ref 43.0–77.0)
Neutro Abs: 5.4 10*3/uL (ref 1.4–7.7)
PLATELETS: 228 10*3/uL (ref 150.0–400.0)
RBC: 5.1 Mil/uL (ref 4.22–5.81)
RDW: 13.3 % (ref 11.5–15.5)
WBC: 8.2 10*3/uL (ref 4.0–10.5)

## 2016-08-22 LAB — BASIC METABOLIC PANEL
BUN: 11 mg/dL (ref 6–23)
CHLORIDE: 105 meq/L (ref 96–112)
CO2: 26 mEq/L (ref 19–32)
CREATININE: 0.9 mg/dL (ref 0.40–1.50)
Calcium: 9.5 mg/dL (ref 8.4–10.5)
GFR: 103.33 mL/min (ref >60.00)
GLUCOSE: 89 mg/dL (ref 70–99)
Potassium: 4.3 mEq/L (ref 3.5–5.1)
Sodium: 138 mEq/L (ref 135–145)

## 2016-08-22 LAB — TSH: TSH: 1.79 u[IU]/mL (ref 0.35–4.50)

## 2016-08-22 NOTE — Progress Notes (Signed)
Subjective:     Patient ID: Glen Cain, male   DOB: 01-11-1986, 31 y.o.   MRN: [id number]  HPI Patient seen for physical exam. Generally very healthy. Takes no regular medications. He's had some nonspecific fatigue in recent weeks. He is working very busy schedule and usually gets 8 hours sleep at night -sometimes poor quality of sleep. No suspicion for sleep apnea (wife has not observed any apnea and no daytime somnolence). Appetite has been good. Weight stable. Has just recently started back more consistent exercise. Denies any fevers, chills, or night sweats.  Patient is nonsmoker. Health maintenance up-to-date. No regular alcohol use. He is married. No children.  Past Medical History:  Diagnosis Date  . Benign neoplasm of skin, site unspecified 08/22/2009   Past Surgical History:  Procedure Laterality Date  . ARTHROSCOPIC REPAIR ACL Right 07  . CHOLECYSTECTOMY  12/15  . KNEE ARTHROSCOPY Right 06/16/2013   Procedure: RIGHT ARTHROSCOPY KNEE MENISECTOMY WITH DEBRIDEMENT ;  Surgeon: Marin Shutter, MD;  Location: [place];  Service: Orthopedics;  Laterality: Right;  . WISDOM TOOTH EXTRACTION      reports that he quit smoking about 7 years ago. His smoking use included Cigarettes. He has never used smokeless tobacco. He reports that he drinks about 3.6 oz of alcohol per week . He reports that he does not use drugs. family history is not on file. No Known Allergies   Review of Systems  Constitutional: Positive for fatigue. Negative for activity change, appetite change, fever and unexpected weight change.  HENT: Negative for congestion, ear pain and trouble swallowing.   Eyes: Negative for pain and visual disturbance.  Respiratory: Negative for cough, shortness of breath and wheezing.   Cardiovascular: Negative for chest pain and palpitations.  Gastrointestinal: Negative for abdominal distention, abdominal pain, blood in stool, constipation, diarrhea, nausea, rectal pain and vomiting.   Endocrine: Negative for polydipsia and polyuria.  Genitourinary: Negative for dysuria, hematuria and testicular pain.  Musculoskeletal: Negative for arthralgias and joint swelling.  Skin: Negative for rash.  Neurological: Negative for dizziness, syncope and headaches.  Hematological: Negative for adenopathy.  Psychiatric/Behavioral: Negative for confusion and dysphoric mood.       Objective:   Physical Exam  Constitutional: He is oriented to person, place, and time. He appears well-developed and well-nourished. No distress.  HENT:  Head: Normocephalic and atraumatic.  Right Ear: External ear normal.  Left Ear: External ear normal.  Mouth/Throat: Oropharynx is clear and moist.  Eyes: Conjunctivae and EOM are normal. Pupils are equal, round, and reactive to light.  Neck: Normal range of motion. Neck supple. No thyromegaly present.  Cardiovascular: Normal rate, regular rhythm and normal heart sounds.   No murmur heard. Pulmonary/Chest: No respiratory distress. He has no wheezes. He has no rales.  Abdominal: Soft. Bowel sounds are normal. He exhibits no distension and no mass. There is no tenderness. There is no rebound and no guarding.  Musculoskeletal: He exhibits no edema.  Lymphadenopathy:    He has no cervical adenopathy.  Neurological: He is alert and oriented to person, place, and time. He displays normal reflexes. No cranial nerve deficit.  Skin: No rash noted.  Psychiatric: He has a normal mood and affect.       Assessment:     Physical exam. Generally healthy 31 year old male. He has some nonspecific fatigue but nonfocal exam    Plan:     -Obtain screening lab work -Establish more consistent exercise -Get at least 8 hours sleep  per night -Try scaling back high glycemic foods  Eulas Post MD [facility] at [facility]

## 2016-08-22 NOTE — Patient Instructions (Signed)
Try to scale back sugars and white starches Stay well hydrated Get at least 8 hours sleep per night Continue with regular exercise We will call you with labs done today. Consider gluten free trial.

## 2017-03-03 DIAGNOSIS — Z23 Encounter for immunization: Secondary | ICD-10-CM | POA: Diagnosis not present

## 2017-04-06 DIAGNOSIS — J029 Acute pharyngitis, unspecified: Secondary | ICD-10-CM | POA: Diagnosis not present

## 2018-04-12 ENCOUNTER — Encounter: Payer: Self-pay | Admitting: Family Medicine

## 2018-04-12 ENCOUNTER — Other Ambulatory Visit: Payer: Self-pay

## 2018-04-12 ENCOUNTER — Ambulatory Visit (INDEPENDENT_AMBULATORY_CARE_PROVIDER_SITE_OTHER): Payer: Commercial Managed Care - PPO | Admitting: Family Medicine

## 2018-04-12 VITALS — BP 120/80 | HR 71 | Temp 98.2°F | Ht 69.0 in | Wt 210.4 lb

## 2018-04-12 DIAGNOSIS — Z0000 Encounter for general adult medical examination without abnormal findings: Secondary | ICD-10-CM | POA: Diagnosis not present

## 2018-04-12 LAB — HEPATIC FUNCTION PANEL
ALT: 17 U/L (ref 0–53)
AST: 19 U/L (ref 0–37)
Albumin: 4.6 g/dL (ref 3.5–5.2)
Alkaline Phosphatase: 56 U/L (ref 39–117)
BILIRUBIN TOTAL: 0.7 mg/dL (ref 0.2–1.2)
Bilirubin, Direct: 0.2 mg/dL (ref 0.0–0.3)
TOTAL PROTEIN: 7.3 g/dL (ref 6.0–8.3)

## 2018-04-12 LAB — CBC WITH DIFFERENTIAL/PLATELET
BASOS ABS: 0 10*3/uL (ref 0.0–0.1)
BASOS PCT: 0.5 % (ref 0.0–3.0)
EOS ABS: 0.2 10*3/uL (ref 0.0–0.7)
EOS PCT: 2.8 % (ref 0.0–5.0)
HCT: 44.3 % (ref 39.0–52.0)
Hemoglobin: 15.4 g/dL (ref 13.0–17.0)
LYMPHS ABS: 1.9 10*3/uL (ref 0.7–4.0)
LYMPHS PCT: 32.5 % (ref 12.0–46.0)
MCHC: 34.7 g/dL (ref 30.0–36.0)
MCV: 89.6 fl (ref 78.0–100.0)
Monocytes Absolute: 0.5 10*3/uL (ref 0.1–1.0)
Monocytes Relative: 9.6 % (ref 3.0–12.0)
NEUTROS ABS: 3.1 10*3/uL (ref 1.4–7.7)
NEUTROS PCT: 54.6 % (ref 43.0–77.0)
PLATELETS: 229 10*3/uL (ref 150.0–400.0)
RBC: 4.94 Mil/uL (ref 4.22–5.81)
RDW: 13.3 % (ref 11.5–15.5)
WBC: 5.7 10*3/uL (ref 4.0–10.5)

## 2018-04-12 LAB — BASIC METABOLIC PANEL
BUN: 16 mg/dL (ref 6–23)
CHLORIDE: 104 meq/L (ref 96–112)
CO2: 24 mEq/L (ref 19–32)
CREATININE: 1 mg/dL (ref 0.40–1.50)
Calcium: 9.7 mg/dL (ref 8.4–10.5)
GFR: 90.6 mL/min (ref >60.00)
Glucose, Bld: 72 mg/dL (ref 70–99)
POTASSIUM: 4.8 meq/L (ref 3.5–5.1)
Sodium: 138 mEq/L (ref 135–145)

## 2018-04-12 LAB — LIPID PANEL
CHOLESTEROL: 123 mg/dL (ref 0–200)
HDL: 40.4 mg/dL (ref >39.00)
LDL Cholesterol: 72 mg/dL (ref 0–99)
NonHDL: 82.21
TRIGLYCERIDES: 50 mg/dL (ref 0.0–149.0)
Total CHOL/HDL Ratio: 3
VLDL: 10 mg/dL (ref 0.0–40.0)

## 2018-04-12 LAB — TSH: TSH: 1.47 u[IU]/mL (ref 0.35–4.50)

## 2018-04-12 NOTE — Progress Notes (Signed)
Subjective:     Patient ID: Glen Cain, male   DOB: [birth year], 33 y.o.   MRN: [id number]  HPI Patient is seen for physical exam.  Generally healthy.  He takes no medications.  No chronic medical problems.  His weight is essentially unchanged from last year.  He has recently started tracking calories with an application called my fitness pal.  He will need tetanus booster by next year.  He has already had flu vaccine.  Patient is married.  No children yet.  They are considering children soon.  Never smoked.  No regular alcohol use.  Past Medical History:  Diagnosis Date  . Benign neoplasm of skin, site unspecified 08/22/2009   Past Surgical History:  Procedure Laterality Date  . ARTHROSCOPIC REPAIR ACL Right 07  . CHOLECYSTECTOMY  12/15  . KNEE ARTHROSCOPY Right 06/16/2013   Procedure: RIGHT ARTHROSCOPY KNEE MENISECTOMY WITH DEBRIDEMENT ;  Surgeon: Marin Shutter, MD;  Location: [place];  Service: Orthopedics;  Laterality: Right;  . WISDOM TOOTH EXTRACTION      reports that he quit smoking about 8 years ago. His smoking use included cigarettes. He has never used smokeless tobacco. He reports current alcohol use of about 6.0 standard drinks of alcohol per week. He reports that he does not use drugs. family history includes Cancer in an other family member; Diabetes in an other family member; Hypertension in an other family member; Stroke in an other family member. No Known Allergies   Review of Systems  Constitutional: Negative for activity change, appetite change, fatigue and fever.  HENT: Negative for congestion, ear pain and trouble swallowing.   Eyes: Negative for pain and visual disturbance.  Respiratory: Negative for cough, shortness of breath and wheezing.   Cardiovascular: Negative for chest pain and palpitations.  Gastrointestinal: Negative for abdominal distention, abdominal pain, blood in stool, constipation, diarrhea, nausea, rectal pain and vomiting.  Genitourinary:  Negative for dysuria, hematuria and testicular pain.  Musculoskeletal: Negative for arthralgias and joint swelling.  Skin: Negative for rash.  Neurological: Negative for dizziness, syncope and headaches.  Hematological: Negative for adenopathy.  Psychiatric/Behavioral: Negative for confusion and dysphoric mood.       Objective:   Physical Exam Constitutional:      General: He is not in acute distress.    Appearance: He is well-developed.  HENT:     Head: Normocephalic and atraumatic.     Right Ear: External ear normal.     Left Ear: External ear normal.  Eyes:     Conjunctiva/sclera: Conjunctivae normal.     Pupils: Pupils are equal, round, and reactive to light.  Neck:     Musculoskeletal: Normal range of motion and neck supple.     Thyroid: No thyromegaly.  Cardiovascular:     Rate and Rhythm: Normal rate and regular rhythm.     Heart sounds: Normal heart sounds. No murmur.  Pulmonary:     Effort: No respiratory distress.     Breath sounds: No wheezing or rales.  Abdominal:     General: Bowel sounds are normal. There is no distension.     Palpations: Abdomen is soft. There is no mass.     Tenderness: There is no abdominal tenderness. There is no guarding or rebound.  Lymphadenopathy:     Cervical: No cervical adenopathy.  Skin:    Findings: No rash.  Neurological:     Mental Status: He is alert and oriented to person, place, and time.  Cranial Nerves: No cranial nerve deficit.     Deep Tendon Reflexes: Reflexes normal.        Assessment:     Physical exam.  Generally healthy 33 year old male    Plan:     -We will need tetanus booster by next year -Continue with annual flu vaccine -Obtain screening lab work  Eulas Post MD [facility] at [facility]

## 2018-04-12 NOTE — Patient Instructions (Signed)
We need to consider tetanus booster by next year  We will call you with lab results.

## 2019-05-17 ENCOUNTER — Telehealth: Payer: Self-pay | Admitting: Family Medicine

## 2019-05-17 NOTE — Telephone Encounter (Signed)
Pt wants to know if he can have Tdap vaccine due to his wife being pregnant. He would like a phone call back from nurse.

## 2019-05-17 NOTE — Telephone Encounter (Signed)
Called patient and scheduled his CPE since he is due and he will receive Tdap at this time on Wednesday, 06/08/19. Patient verbalized an understanding.

## 2019-06-07 ENCOUNTER — Other Ambulatory Visit: Payer: Self-pay

## 2019-06-08 ENCOUNTER — Ambulatory Visit (INDEPENDENT_AMBULATORY_CARE_PROVIDER_SITE_OTHER): Payer: Commercial Managed Care - PPO | Admitting: Family Medicine

## 2019-06-08 ENCOUNTER — Encounter: Payer: Self-pay | Admitting: Family Medicine

## 2019-06-08 ENCOUNTER — Other Ambulatory Visit: Payer: Self-pay

## 2019-06-08 VITALS — BP 110/68 | HR 72 | Temp 97.7°F | Ht 70.0 in | Wt 211.1 lb

## 2019-06-08 DIAGNOSIS — Z0000 Encounter for general adult medical examination without abnormal findings: Secondary | ICD-10-CM | POA: Diagnosis not present

## 2019-06-08 DIAGNOSIS — Z23 Encounter for immunization: Secondary | ICD-10-CM

## 2019-06-08 LAB — BASIC METABOLIC PANEL
BUN: 11 mg/dL (ref 6–23)
CO2: 27 mEq/L (ref 19–32)
Calcium: 9.2 mg/dL (ref 8.4–10.5)
Chloride: 106 mEq/L (ref 96–112)
Creatinine, Ser: 0.92 mg/dL (ref 0.40–1.50)
GFR: 93.23 mL/min (ref >60.00)
Glucose, Bld: 91 mg/dL (ref 70–99)
Potassium: 4.1 mEq/L (ref 3.5–5.1)
Sodium: 140 mEq/L (ref 135–145)

## 2019-06-08 LAB — CBC WITH DIFFERENTIAL/PLATELET
Basophils Absolute: 0 10*3/uL (ref 0.0–0.1)
Basophils Relative: 0.3 % (ref 0.0–3.0)
Eosinophils Absolute: 0.2 10*3/uL (ref 0.0–0.7)
Eosinophils Relative: 2.5 % (ref 0.0–5.0)
HCT: 42.1 % (ref 39.0–52.0)
Hemoglobin: 14.9 g/dL (ref 13.0–17.0)
Lymphocytes Relative: 24.5 % (ref 12.0–46.0)
Lymphs Abs: 2 10*3/uL (ref 0.7–4.0)
MCHC: 35.4 g/dL (ref 30.0–36.0)
MCV: 87.9 fl (ref 78.0–100.0)
Monocytes Absolute: 0.8 10*3/uL (ref 0.1–1.0)
Monocytes Relative: 9.9 % (ref 3.0–12.0)
Neutro Abs: 5.1 10*3/uL (ref 1.4–7.7)
Neutrophils Relative %: 62.8 % (ref 43.0–77.0)
Platelets: 232 10*3/uL (ref 150.0–400.0)
RBC: 4.79 Mil/uL (ref 4.22–5.81)
RDW: 13.2 % (ref 11.5–15.5)
WBC: 8.1 10*3/uL (ref 4.0–10.5)

## 2019-06-08 LAB — LIPID PANEL
Cholesterol: 122 mg/dL (ref 0–200)
HDL: 31.7 mg/dL — ABNORMAL LOW (ref >39.00)
LDL Cholesterol: 71 mg/dL (ref 0–99)
NonHDL: 90.76
Total CHOL/HDL Ratio: 4
Triglycerides: 97 mg/dL (ref 0.0–149.0)
VLDL: 19.4 mg/dL (ref 0.0–40.0)

## 2019-06-08 LAB — HEPATIC FUNCTION PANEL
ALT: 18 U/L (ref 0–53)
AST: 16 U/L (ref 0–37)
Albumin: 4.2 g/dL (ref 3.5–5.2)
Alkaline Phosphatase: 62 U/L (ref 39–117)
Bilirubin, Direct: 0.2 mg/dL (ref 0.0–0.3)
Total Bilirubin: 0.7 mg/dL (ref 0.2–1.2)
Total Protein: 6.9 g/dL (ref 6.0–8.3)

## 2019-06-08 LAB — TSH: TSH: 1.59 u[IU]/mL (ref 0.35–4.50)

## 2019-06-08 NOTE — Patient Instructions (Signed)

## 2019-06-08 NOTE — Progress Notes (Signed)
Subjective:     Patient ID: Glen Cain, male   DOB: 22-May-1985, 34 y.o.   MRN: [id number]  HPI  Albaro is seen for physical exam.  Generally very healthy.  He and his wife are expecting their first child and are currently 7 months pregnant.  They are expecting a son.  Pregnancy is going well thus far.  His last tetanus was 10 years ago.  He did receive Tdap then.  He is currently taking no medications. Not getting consistent exercise currently  Past Medical History:  Diagnosis Date  . Benign neoplasm of skin, site unspecified 08/22/2009   Past Surgical History:  Procedure Laterality Date  . ARTHROSCOPIC REPAIR ACL Right 07  . CHOLECYSTECTOMY  12/15  . KNEE ARTHROSCOPY Right 06/16/2013   Procedure: RIGHT ARTHROSCOPY KNEE MENISECTOMY WITH DEBRIDEMENT ;  Surgeon: Marin Shutter, MD;  Location: [place];  Service: Orthopedics;  Laterality: Right;  . WISDOM TOOTH EXTRACTION      reports that he quit smoking about 9 years ago. His smoking use included cigarettes. He has never used smokeless tobacco. He reports current alcohol use of about 6.0 standard drinks of alcohol per week. He reports that he does not use drugs. family history includes Cancer in an other family member; Diabetes in an other family member; Hypertension in an other family member; Stroke in an other family member. No Known Allergies  Wt Readings from Last 3 Encounters:  06/08/19 211 lb 1.6 oz (95.8 kg)  04/12/18 210 lb 6.4 oz (95.4 kg)  08/22/16 212 lb 9.6 oz (96.4 kg)     Review of Systems  Constitutional: Negative for activity change, appetite change, fatigue, fever and unexpected weight change.  HENT: Negative for congestion, ear pain and trouble swallowing.   Eyes: Negative for pain and visual disturbance.  Respiratory: Negative for cough, shortness of breath and wheezing.   Cardiovascular: Negative for chest pain and palpitations.  Gastrointestinal: Negative for abdominal distention, abdominal pain, blood in  stool, constipation, diarrhea, nausea, rectal pain and vomiting.  Endocrine: Negative for polydipsia and polyuria.  Genitourinary: Negative for dysuria, hematuria and testicular pain.  Musculoskeletal: Negative for arthralgias and joint swelling.  Skin: Negative for rash.  Neurological: Negative for dizziness, syncope and headaches.  Hematological: Negative for adenopathy.  Psychiatric/Behavioral: Negative for confusion and dysphoric mood.       Objective:   Physical Exam Vitals reviewed.  Constitutional:      General: He is not in acute distress.    Appearance: He is well-developed.  HENT:     Head: Normocephalic and atraumatic.     Right Ear: External ear normal.     Left Ear: External ear normal.  Eyes:     Conjunctiva/sclera: Conjunctivae normal.     Pupils: Pupils are equal, round, and reactive to light.  Neck:     Thyroid: No thyromegaly.  Cardiovascular:     Rate and Rhythm: Normal rate and regular rhythm.     Heart sounds: Normal heart sounds. No murmur.  Pulmonary:     Effort: No respiratory distress.     Breath sounds: No wheezing or rales.  Abdominal:     General: Bowel sounds are normal. There is no distension.     Palpations: Abdomen is soft. There is no mass.     Tenderness: There is no abdominal tenderness. There is no guarding or rebound.  Musculoskeletal:     Cervical back: Normal range of motion and neck supple.  Lymphadenopathy:  Cervical: No cervical adenopathy.  Skin:    Findings: No rash.  Neurological:     Mental Status: He is alert and oriented to person, place, and time.     Cranial Nerves: No cranial nerve deficit.     Deep Tendon Reflexes: Reflexes normal.        Assessment:     Physical exam.  Generally healthy 34 year old male.  Needs Tdap    Plan:     -Tdap booster given -Obtain screening labs Establish more consistent exercise   Eulas Post MD [facility] at [facility]

## 2019-06-09 ENCOUNTER — Telehealth: Payer: Self-pay | Admitting: Family Medicine

## 2019-06-09 NOTE — Telephone Encounter (Signed)
Pt was returning Kendra's call about lab results.   Pt can be reached at [phone]

## 2019-06-10 NOTE — Telephone Encounter (Signed)
This has been taking care of. Please see lab results for f/u.

## 2019-06-21 ENCOUNTER — Telehealth: Payer: Self-pay | Admitting: Family Medicine

## 2019-06-21 NOTE — Telephone Encounter (Signed)
Pt has been notified.

## 2019-06-21 NOTE — Telephone Encounter (Signed)
Yes-14 days should be adequate

## 2019-06-21 NOTE — Telephone Encounter (Signed)
Please advise 

## 2019-06-21 NOTE — Telephone Encounter (Signed)
  pt had his Tdap shot on  06/08/2019, and he will have its Covid vaccine on 06/23/2019; want to know if this is enough time in between  the two-shot Pt can be reached at [phone]

## 2019-11-21 ENCOUNTER — Encounter: Payer: Self-pay | Admitting: Family Medicine

## 2019-11-21 ENCOUNTER — Telehealth (INDEPENDENT_AMBULATORY_CARE_PROVIDER_SITE_OTHER): Payer: Commercial Managed Care - PPO | Admitting: Family Medicine

## 2019-11-21 VITALS — Temp 98.2°F | Ht 70.0 in | Wt 200.0 lb

## 2019-11-21 DIAGNOSIS — K529 Noninfective gastroenteritis and colitis, unspecified: Secondary | ICD-10-CM | POA: Diagnosis not present

## 2019-11-21 NOTE — Progress Notes (Signed)
Patient ID: Glen Cain, male   DOB: 1985/05/25, 34 y.o.   MRN: [id number]   This visit type was conducted due to national recommendations for restrictions regarding the COVID-19 pandemic in an effort to limit this patient's exposure and mitigate transmission in our community.   Virtual Visit via Telephone Note  I connected with Glen Cain on 11/21/19 at  3:00 PM EDT by telephone and verified that I am speaking with the correct person using two identifiers.   I discussed the limitations, risks, security and privacy concerns of performing an evaluation and management service by telephone and the availability of in person appointments. I also discussed with the patient that there may be a patient responsible charge related to this service. The patient expressed understanding and agreed to proceed.  Location patient: home Location provider: work or home office Participants present for the call: patient, provider Patient did not have a visit in the prior 7 days to address this/these issue(s).   History of Present Illness: [name] called with concern for possible food poisoning.  Saturday night around 6 PM he ate crab cakes sandwich at a restaurant.  Around 1 AM he had some abdominal cramps about 4 AM.   He had some vomiting with a couple more episodes Sunday morning.  Has had some bloating since then but no further nausea or vomiting.  He had chills but no fever.  He has had some mild diarrhea symptoms.  No bloody stools.  He has a 73-monthold son but he has had no symptoms.  No known sick exposures.  No recent antibiotics or travels.   Past Medical History:  Diagnosis Date  . Benign neoplasm of skin, site unspecified 08/22/2009   Past Surgical History:  Procedure Laterality Date  . ARTHROSCOPIC REPAIR ACL Right 07  . CHOLECYSTECTOMY  12/15  . KNEE ARTHROSCOPY Right 06/16/2013   Procedure: RIGHT ARTHROSCOPY KNEE MENISECTOMY WITH DEBRIDEMENT ;  Surgeon: KMarin Shutter MD;  Location: [place]   Service: Orthopedics;  Laterality: Right;  . WISDOM TOOTH EXTRACTION      reports that he quit smoking about 10 years ago. His smoking use included cigarettes. He has never used smokeless tobacco. He reports current alcohol use of about 6.0 standard drinks of alcohol per week. He reports that he does not use drugs. family history includes Cancer in an other family member; Diabetes in an other family member; Hypertension in an other family member; Stroke in an other family member. No Known Allergies    Observations/Objective: Patient sounds cheerful and well on the phone. I do not appreciate any SOB. Speech and thought processing are grossly intact. Patient reported vitals:  Assessment and Plan:  Diarrhea with recent transient nausea and vomiting.  Most likely viral illness.  He has not had any documented fever or bloody stools or high suspicion for bacterial infection and is overall improved some  -Continue electrolyte replacement good hydration -Discussed importance of bland diet until this is fully resolved -Follow-up promptly for fever, bloody stools, or worsening symptoms  Follow Up Instructions:  -As above   99441 5-10 99442 11-20 99443 21-30 I did not refer this patient for an OV in the next 24 hours for this/these issue(s).  I discussed the assessment and treatment plan with the patient. The patient was provided an opportunity to ask questions and all were answered. The patient agreed with the plan and demonstrated an understanding of the instructions.   The patient was advised to call back or seek  an in-person evaluation if the symptoms worsen or if the condition fails to improve as anticipated.  I provided 15 minutes of non-face-to-face time during this encounter.   Carolann Littler, MD

## 2019-11-21 NOTE — Patient Instructions (Signed)
Health Maintenance Due  Topic Date Due  . Hepatitis C Screening  Never done  . COVID-19 Vaccine (1) Never done  . INFLUENZA VACCINE  10/30/2019    Depression screen [facility] 2/9 06/08/2019 04/12/2018 04/23/2015  Decreased Interest 0 0 0  Down, Depressed, Hopeless 0 0 0  PHQ - 2 Score 0 0 0  Altered sleeping 0 - -  Tired, decreased energy 0 - -  Change in appetite 0 - -  Feeling bad or failure about yourself  0 - -  Trouble concentrating 0 - -  Moving slowly or fidgety/restless 0 - -  Suicidal thoughts 0 - -  PHQ-9 Score 0 - -  Difficult doing work/chores Not difficult at all - -

## 2020-04-30 ENCOUNTER — Ambulatory Visit (INDEPENDENT_AMBULATORY_CARE_PROVIDER_SITE_OTHER): Payer: Commercial Managed Care - PPO | Admitting: Family Medicine

## 2020-04-30 ENCOUNTER — Other Ambulatory Visit: Payer: Self-pay

## 2020-04-30 ENCOUNTER — Encounter: Payer: Self-pay | Admitting: Family Medicine

## 2020-04-30 VITALS — BP 138/80 | HR 84 | Ht 70.0 in | Wt 210.0 lb

## 2020-04-30 DIAGNOSIS — K644 Residual hemorrhoidal skin tags: Secondary | ICD-10-CM

## 2020-04-30 MED ORDER — HYDROCORT-PRAMOXINE (PERIANAL) 1-1 % EX FOAM: 1.0000 | Freq: Two times a day (BID) | CUTANEOUS | 1 refills | Status: DC

## 2020-04-30 NOTE — Patient Instructions (Signed)

## 2020-04-30 NOTE — Progress Notes (Signed)
Established Patient Office Visit  Subjective:  Patient ID: Glen Cain, male    DOB: 1985-05-09  Age: 35 y.o. MRN: [id number]  CC:  Chief Complaint  Patient presents with  . Hemorrhoids    HPI Glen Cain presents for possible hemorrhoid.  He states he has had some pain with wiping but not particularly with bowel movements for past couple days.  Noticed little blood on one occasion this morning with wiping.Marland Kitchen  Has had previous cholecystectomy and generally does not suffer any constipation issues.  He tried some Preparation H this morning.  No past history of hemorrhoids.  Otherwise no change in bowel habits.  No recent appetite or weight changes.   Past Medical History:  Diagnosis Date  . Benign neoplasm of skin, site unspecified 08/22/2009    Past Surgical History:  Procedure Laterality Date  . ARTHROSCOPIC REPAIR ACL Right 07  . CHOLECYSTECTOMY  12/15  . KNEE ARTHROSCOPY Right 06/16/2013   Procedure: RIGHT ARTHROSCOPY KNEE MENISECTOMY WITH DEBRIDEMENT ;  Surgeon: Marin Shutter, MD;  Location: [place];  Service: Orthopedics;  Laterality: Right;  . WISDOM TOOTH EXTRACTION      Family History  Problem Relation Age of Onset  . Diabetes Other        fhx  . Hypertension Other        fhx  . Cancer Other        fhx  . Stroke Other        fhx    Social History   Socioeconomic History  . Marital status: Married    Spouse name: Not on file  . Number of children: Not on file  . Years of education: Not on file  . Highest education level: Not on file  Occupational History  . Not on file  Tobacco Use  . Smoking status: Former Smoker    Types: Cigarettes    Quit date: 06/14/2009    Years since quitting: 10.8  . Smokeless tobacco: Never Used  . Tobacco comment: occ  Vaping Use  . Vaping Use: Never used  Substance and Sexual Activity  . Alcohol use: Yes    Alcohol/week: 6.0 standard drinks    Types: 6 Cans of beer per week  . Drug use: No  . Sexual activity: Not on  file  Other Topics Concern  . Not on file  Social History Narrative  . Not on file   Social Determinants of Health   Financial Resource Strain: Not on file  Food Insecurity: Not on file  Transportation Needs: Not on file  Physical Activity: Not on file  Stress: Not on file  Social Connections: Not on file  Intimate Partner Violence: Not on file    Outpatient Medications Prior to Visit  Medication Sig Dispense Refill  . Multiple Vitamin (MULTIVITAMIN) tablet Take 1 tablet by mouth daily.     No facility-administered medications prior to visit.    No Known Allergies  ROS Review of Systems  Constitutional: Negative for chills and fever.  Gastrointestinal: Negative for abdominal pain, constipation and diarrhea.      Objective:    Physical Exam Vitals reviewed.  Constitutional:      Appearance: Normal appearance.  Cardiovascular:     Rate and Rhythm: Normal rate and regular rhythm.  Pulmonary:     Effort: Pulmonary effort is normal.     Breath sounds: Normal breath sounds.  Genitourinary:    Comments: He has external nonthrombosed hemorrhoid in the 6 o'clock  position.  No erythema or fluctuance to suggest abscess.  No active bleeding noted.  No visible anal fissure though he had some soiling and thick cream that he had applied which made this difficult to assess. Neurological:     Mental Status: He is alert.     BP 138/80   Pulse 84   Ht 5' 10"  (1.778 m)   Wt 210 lb (95.3 kg)   SpO2 98%   BMI 30.13 kg/m  Wt Readings from Last 3 Encounters:  04/30/20 210 lb (95.3 kg)  11/21/19 200 lb (90.7 kg)  06/08/19 211 lb 1.6 oz (95.8 kg)     Health Maintenance Due  Topic Date Due  . Hepatitis C Screening  Never done  . COVID-19 Vaccine (1) Never done  . INFLUENZA VACCINE  10/30/2019    There are no preventive care reminders to display for this patient.  Lab Results  Component Value Date   TSH 1.59 06/08/2019   Lab Results  Component Value Date   WBC 8.1  06/08/2019   HGB 14.9 06/08/2019   HCT 42.1 06/08/2019   MCV 87.9 06/08/2019   PLT 232.0 06/08/2019   Lab Results  Component Value Date   NA 140 06/08/2019   K 4.1 06/08/2019   CO2 27 06/08/2019   GLUCOSE 91 06/08/2019   BUN 11 06/08/2019   CREATININE 0.92 06/08/2019   BILITOT 0.7 06/08/2019   ALKPHOS 62 06/08/2019   AST 16 06/08/2019   ALT 18 06/08/2019   PROT 6.9 06/08/2019   ALBUMIN 4.2 06/08/2019   CALCIUM 9.2 06/08/2019   GFR 93.23 06/08/2019   Lab Results  Component Value Date   CHOL 122 06/08/2019   Lab Results  Component Value Date   HDL 31.70 (L) 06/08/2019   Lab Results  Component Value Date   LDLCALC 71 06/08/2019   Lab Results  Component Value Date   TRIG 97.0 06/08/2019   Lab Results  Component Value Date   CHOLHDL 4 06/08/2019   No results found for: HGBA1C    Assessment & Plan:   External hemorrhoid.  No evidence for acute thrombosis. -Recommended measures to reduce constipation and sitz baths -Continue Anusol HC cream -Printed prescription for Proctofoam [facility] for any continued discomfort -Touch base if symptoms not improving over the next couple of weeks  No orders of the defined types were placed in this encounter.   Follow-up: No follow-ups on file.    Carolann Littler, MD

## 2020-05-03 ENCOUNTER — Telehealth: Payer: Self-pay | Admitting: Family Medicine

## 2020-05-03 MED ORDER — AMOXICILLIN-POT CLAVULANATE 875-125 MG PO TABS: 1.0000 | ORAL_TABLET | Freq: Three times a day (TID) | ORAL | 0 refills | Status: DC

## 2020-05-03 NOTE — Telephone Encounter (Signed)
Medication sent to pharmacy.  Patient aware and appointment scheduled.

## 2020-05-03 NOTE — Telephone Encounter (Signed)
The patient is now having puss and drainage. He's wanting to know if he needs to make another appointment or can you just call him in something to the pharmacy.  Please advise

## 2020-05-03 NOTE — Telephone Encounter (Signed)
Send in Augmentin 875 mg one po tid with food for 10 days and recommend twice daily sitz baths.    Offer office follow up by Friday (tomorrow) if not feeling any better.

## 2020-05-04 ENCOUNTER — Other Ambulatory Visit: Payer: Self-pay

## 2020-05-04 ENCOUNTER — Encounter: Payer: Self-pay | Admitting: Family Medicine

## 2020-05-04 ENCOUNTER — Ambulatory Visit (INDEPENDENT_AMBULATORY_CARE_PROVIDER_SITE_OTHER): Payer: Commercial Managed Care - PPO | Admitting: Family Medicine

## 2020-05-04 VITALS — BP 124/82 | HR 67 | Ht 70.0 in | Wt 209.0 lb

## 2020-05-04 DIAGNOSIS — K644 Residual hemorrhoidal skin tags: Secondary | ICD-10-CM

## 2020-05-04 NOTE — Patient Instructions (Signed)
Follow up for any fever, chills, increased redness, or swelling.

## 2020-05-04 NOTE — Progress Notes (Signed)
Established Patient Office Visit  Subjective:  Patient ID: Glen Cain, male    DOB: 1986-01-07  Age: 35 y.o. MRN: [id number]  CC:  Chief Complaint  Patient presents with  . Follow-up    HPI Glen Cain presents for follow-up of some perianal pain.Marland Kitchen  Refer to recent note.  He was just seen here on 04/30/2020 with external hemorrhoid.  After he left he had some increasing pain and one night noticed some drainage would look like pus.  Since that time he has been doing warm sitz baths regularly.  He has expressed the bit of pus couple times.  No fever.  We called in some Augmentin which he is currently taking without any side effect.  He states his pain is relatively mild at this time.  Past Medical History:  Diagnosis Date  . Benign neoplasm of skin, site unspecified 08/22/2009    Past Surgical History:  Procedure Laterality Date  . ARTHROSCOPIC REPAIR ACL Right 07  . CHOLECYSTECTOMY  12/15  . KNEE ARTHROSCOPY Right 06/16/2013   Procedure: RIGHT ARTHROSCOPY KNEE MENISECTOMY WITH DEBRIDEMENT ;  Surgeon: Marin Shutter, MD;  Location: [place];  Service: Orthopedics;  Laterality: Right;  . WISDOM TOOTH EXTRACTION      Family History  Problem Relation Age of Onset  . Diabetes Other        fhx  . Hypertension Other        fhx  . Cancer Other        fhx  . Stroke Other        fhx    Social History   Socioeconomic History  . Marital status: Married    Spouse name: Not on file  . Number of children: Not on file  . Years of education: Not on file  . Highest education level: Not on file  Occupational History  . Not on file  Tobacco Use  . Smoking status: Former Smoker    Types: Cigarettes    Quit date: 06/14/2009    Years since quitting: 10.8  . Smokeless tobacco: Never Used  . Tobacco comment: occ  Vaping Use  . Vaping Use: Never used  Substance and Sexual Activity  . Alcohol use: Yes    Alcohol/week: 6.0 standard drinks    Types: 6 Cans of beer per week  . Drug  use: No  . Sexual activity: Not on file  Other Topics Concern  . Not on file  Social History Narrative  . Not on file   Social Determinants of Health   Financial Resource Strain: Not on file  Food Insecurity: Not on file  Transportation Needs: Not on file  Physical Activity: Not on file  Stress: Not on file  Social Connections: Not on file  Intimate Partner Violence: Not on file    Outpatient Medications Prior to Visit  Medication Sig Dispense Refill  . amoxicillin-clavulanate (AUGMENTIN) 875-125 MG tablet Take 1 tablet by mouth 3 (three) times daily. 30 tablet 0  . hydrocortisone-pramoxine (PROCTOFOAM-HC) rectal foam Place 1 applicator rectally 2 (two) times daily. 10 g 1  . Multiple Vitamin (MULTIVITAMIN) tablet Take 1 tablet by mouth daily.     No facility-administered medications prior to visit.    No Known Allergies  ROS Review of Systems  Constitutional: Negative for chills and fever.  Gastrointestinal: Negative for diarrhea, nausea and vomiting.      Objective:    Physical Exam Vitals reviewed.  Constitutional:      Appearance:  Normal appearance.  Genitourinary:    Comments: Large external hemorrhoid around the 6 o'clock position.  No thrombosis.  No the superior portion of this hemorrhoid he has what appears to be a very small split in the skin/fissure.  There is no erythema except for around the small split in the skin and no warmth and no fluctuance.  Digital exam also done and no internal masses or internal tenderness. Neurological:     Mental Status: He is alert.     BP 124/82   Pulse 67   Ht 5' 10"  (1.778 m)   Wt 209 lb (94.8 kg)   SpO2 96%   BMI 29.99 kg/m  Wt Readings from Last 3 Encounters:  05/04/20 209 lb (94.8 kg)  04/30/20 210 lb (95.3 kg)  11/21/19 200 lb (90.7 kg)     Health Maintenance Due  Topic Date Due  . Hepatitis C Screening  Never done  . COVID-19 Vaccine (1) Never done  . INFLUENZA VACCINE  10/30/2019    There are no  preventive care reminders to display for this patient.  Lab Results  Component Value Date   TSH 1.59 06/08/2019   Lab Results  Component Value Date   WBC 8.1 06/08/2019   HGB 14.9 06/08/2019   HCT 42.1 06/08/2019   MCV 87.9 06/08/2019   PLT 232.0 06/08/2019   Lab Results  Component Value Date   NA 140 06/08/2019   K 4.1 06/08/2019   CO2 27 06/08/2019   GLUCOSE 91 06/08/2019   BUN 11 06/08/2019   CREATININE 0.92 06/08/2019   BILITOT 0.7 06/08/2019   ALKPHOS 62 06/08/2019   AST 16 06/08/2019   ALT 18 06/08/2019   PROT 6.9 06/08/2019   ALBUMIN 4.2 06/08/2019   CALCIUM 9.2 06/08/2019   GFR 93.23 06/08/2019   Lab Results  Component Value Date   CHOL 122 06/08/2019   Lab Results  Component Value Date   HDL 31.70 (L) 06/08/2019   Lab Results  Component Value Date   LDLCALC 71 06/08/2019   Lab Results  Component Value Date   TRIG 97.0 06/08/2019   Lab Results  Component Value Date   CHOLHDL 4 06/08/2019   No results found for: HGBA1C    Assessment & Plan:   External hemorrhoid.  No thrombosis.  Does appear to have a small split in the skin which may be causing some discomfort and this is near the superior portion of the hemorrhoid near the anal verge.  There is very small amount of purulence in this area but no fluctuance and no erythema.  -Continue warm sitz bath as -Continue Augmentin -Follow-up for any persistent or worsening pain.  We did not see anything on exam today to indicate need for incision and drainage.  Hopefully this will resolve on its own over the next few days  No orders of the defined types were placed in this encounter.   Follow-up: No follow-ups on file.    Carolann Littler, MD

## 2020-05-08 ENCOUNTER — Ambulatory Visit (INDEPENDENT_AMBULATORY_CARE_PROVIDER_SITE_OTHER): Payer: Commercial Managed Care - PPO | Admitting: Family Medicine

## 2020-05-08 ENCOUNTER — Encounter: Payer: Self-pay | Admitting: Family Medicine

## 2020-05-08 ENCOUNTER — Other Ambulatory Visit: Payer: Self-pay

## 2020-05-08 VITALS — BP 100/68 | HR 78 | Temp 98.0°F | Ht 70.0 in | Wt 210.3 lb

## 2020-05-08 DIAGNOSIS — K610 Anal abscess: Secondary | ICD-10-CM | POA: Diagnosis not present

## 2020-05-08 NOTE — Progress Notes (Signed)
Established Patient Office Visit  Subjective:  Patient ID: Glen Cain, male    DOB: 1985/05/12  Age: 35 y.o. MRN: [id number]  CC:  Chief Complaint  Patient presents with  . Rectal Pain    Patient complains of severe rectal pain x1 day, states this area feels the same as when he had a pilonidal cyst previously, denies drainage    HPI Glen Cain presents for worsening perianal pain.  Was seen last week and had evidence for external hemorrhoid but there was no evidence for thrombosis.  We did not see any erythema at that time.  He was subsequently seen and had very small split in the skin near the superior aspect of the hemorrhoid sac but no fluctuance noted at that time and again no erythema.  Presents today with progressive symptoms.  No fever.  Pain is similar to when he had abscess pilonidal cyst years ago.  He has seen some occasional puslike drainage.  He has been doing warm sitz bath at least twice daily.  Past Medical History:  Diagnosis Date  . Benign neoplasm of skin, site unspecified 08/22/2009    Past Surgical History:  Procedure Laterality Date  . ARTHROSCOPIC REPAIR ACL Right 07  . CHOLECYSTECTOMY  12/15  . KNEE ARTHROSCOPY Right 06/16/2013   Procedure: RIGHT ARTHROSCOPY KNEE MENISECTOMY WITH DEBRIDEMENT ;  Surgeon: Marin Shutter, MD;  Location: [place];  Service: Orthopedics;  Laterality: Right;  . WISDOM TOOTH EXTRACTION      Family History  Problem Relation Age of Onset  . Diabetes Other        fhx  . Hypertension Other        fhx  . Cancer Other        fhx  . Stroke Other        fhx    Social History   Socioeconomic History  . Marital status: Married    Spouse name: Not on file  . Number of children: Not on file  . Years of education: Not on file  . Highest education level: Not on file  Occupational History  . Not on file  Tobacco Use  . Smoking status: Former Smoker    Types: Cigarettes    Quit date: 06/14/2009    Years since quitting: 10.9   . Smokeless tobacco: Never Used  . Tobacco comment: occ  Vaping Use  . Vaping Use: Never used  Substance and Sexual Activity  . Alcohol use: Yes    Alcohol/week: 6.0 standard drinks    Types: 6 Cans of beer per week  . Drug use: No  . Sexual activity: Not on file  Other Topics Concern  . Not on file  Social History Narrative  . Not on file   Social Determinants of Health   Financial Resource Strain: Not on file  Food Insecurity: Not on file  Transportation Needs: Not on file  Physical Activity: Not on file  Stress: Not on file  Social Connections: Not on file  Intimate Partner Violence: Not on file    Outpatient Medications Prior to Visit  Medication Sig Dispense Refill  . amoxicillin-clavulanate (AUGMENTIN) 875-125 MG tablet Take 1 tablet by mouth 3 (three) times daily. 30 tablet 0  . hydrocortisone-pramoxine (PROCTOFOAM-HC) rectal foam Place 1 applicator rectally 2 (two) times daily. 10 g 1  . Multiple Vitamin (MULTIVITAMIN) tablet Take 1 tablet by mouth daily.     No facility-administered medications prior to visit.    No Known  Allergies  ROS Review of Systems  Constitutional: Negative for chills and fever.      Objective:    Physical Exam Vitals reviewed.  Constitutional:      Appearance: Normal appearance.  Cardiovascular:     Rate and Rhythm: Normal rate and regular rhythm.  Genitourinary:    Comments: Perianal area examined.  He has hemorrhoidal mass which has change in appearance from last week.  This is more distended and now fluctuant with some slight erythema.  Very tender to palpation.  No thrombosis. Neurological:     Mental Status: He is alert.     BP 100/68 (BP Location: Left Arm, Patient Position: Sitting, Cuff Size: Large)   Pulse 78   Temp 98 F (36.7 C) (Oral)   Ht 5' 10"  (1.778 m)   Wt 210 lb 4.8 oz (95.4 kg)   BMI 30.17 kg/m  Wt Readings from Last 3 Encounters:  05/08/20 210 lb 4.8 oz (95.4 kg)  05/04/20 209 lb (94.8 kg)   04/30/20 210 lb (95.3 kg)     Health Maintenance Due  Topic Date Due  . COVID-19 Vaccine (3 - Pfizer risk 4-dose series) 07/28/2019    There are no preventive care reminders to display for this patient.  Lab Results  Component Value Date   TSH 1.59 06/08/2019   Lab Results  Component Value Date   WBC 8.1 06/08/2019   HGB 14.9 06/08/2019   HCT 42.1 06/08/2019   MCV 87.9 06/08/2019   PLT 232.0 06/08/2019   Lab Results  Component Value Date   NA 140 06/08/2019   K 4.1 06/08/2019   CO2 27 06/08/2019   GLUCOSE 91 06/08/2019   BUN 11 06/08/2019   CREATININE 0.92 06/08/2019   BILITOT 0.7 06/08/2019   ALKPHOS 62 06/08/2019   AST 16 06/08/2019   ALT 18 06/08/2019   PROT 6.9 06/08/2019   ALBUMIN 4.2 06/08/2019   CALCIUM 9.2 06/08/2019   GFR 93.23 06/08/2019   Lab Results  Component Value Date   CHOL 122 06/08/2019   Lab Results  Component Value Date   HDL 31.70 (L) 06/08/2019   Lab Results  Component Value Date   LDLCALC 71 06/08/2019   Lab Results  Component Value Date   TRIG 97.0 06/08/2019   Lab Results  Component Value Date   CHOLHDL 4 06/08/2019   No results found for: HGBA1C    Assessment & Plan:   Perianal abscess.  This appears to be occurring in the setting of external hemorrhoid.  He did have a very small fissure near the superior aspect of the hemorrhoidal sac and suspect he developed an abscess subsequently.  We did recommend incision and drainage been discussed risk of pain and bleeding.  Patient consented.  Area prepped with Betadine.  Using 25-gauge 1-1/2 inch needle injected 3 cc of plain Xylocaine around the abscess region.  Use #11 blade and made approximately 1 cm linear incision over area of fluctuance and expressed copious mount of pus.  We used some curved hemostats to explore deeper.  Wound cavity packed with quarter inch iodoform gauze.  Minimal bleeding.  Patient tolerated well.  Outer dressing applied.  -Return by late tomorrow to  reexamine.  Consider removing packing at that time and starting sitz baths.  No orders of the defined types were placed in this encounter.   Follow-up: No follow-ups on file.    Carolann Littler, MD

## 2020-05-08 NOTE — Patient Instructions (Signed)
Anorectal Abscess An abscess is an infected area that contains a collection of pus. An anorectal abscess is an abscess that is near the opening of the anus or around the rectum. Without treatment, an anorectal abscess can become larger and cause other problems, such as a more serious body-wide infection or pain, especially during bowel movements. What are the causes? This condition is caused by plugged glands or an infection in one of these areas:  The anus.  The area between the anus and the scrotum in males or between the anus and the vagina in females (perineum). What increases the risk? The following factors may make you more likely to develop this condition:  Diabetes or inflammatory bowel disease.  Having a body defense system (immune system) that is weak.  Engaging in anal sex.  Having a sexually transmitted infection (STI).  Certain kinds of cancer, such as rectal carcinoma, leukemia, or lymphoma. What are the signs or symptoms? The main symptom of this condition is pain. The pain may be a throbbing pain that gets worse during bowel movements. Other symptoms include:  Swelling and redness in the area of the abscess. The redness may go beyond the abscess and appear as a red streak on the skin.  A visible, painful lump, or a lump that can be felt when touched.  Bleeding or pus-like discharge from the area.  Fever.  General weakness.  Constipation.  Diarrhea. How is this diagnosed? This condition is diagnosed based on your medical history and a physical exam of the affected area.  This may involve examining the rectal area with a gloved hand (digital rectal exam).  Sometimes, the health care provider needs to look into the rectum using a probe, scope, or imaging test.  For women, it may require a careful vaginal exam. How is this treated? Treatment for this condition may include:  Incision and drainage surgery. This involves making an incision over the abscess to  drain the pus.  Medicines, including antibiotic medicine, pain medicine, stool softeners, or laxatives. Follow these instructions at home: Medicines  Take over-the-counter and prescription medicines only as told by your health care provider.  If you were prescribed an antibiotic medicine, use it as told by your health care provider. Do not stop using the antibiotic even if you start to feel better.  Do not drive or use heavy machinery while taking prescription pain medicine. Wound care  If gauze was used in the abscess, follow instructions from your health care provider about removing or changing the gauze. It can usually be removed in 2-3 days.  Wash your hands with soap and water before you remove or change your gauze. If soap and water are not available, use hand sanitizer.  If one or more drains were placed in the abscess cavity, be careful not to pull at them. Your health care provider will tell you how long they need to remain in place.  Check your incision area every day for signs of infection. Check for: ? More redness, swelling, or pain. ? More fluid or blood. ? Warmth. ? Pus or a bad smell.   Managing pain, stiffness, and swelling  Take a sitz bath 3-4 times a day and after bowel movements. This will help reduce pain and swelling.  To relieve pain, try sitting: ? On a heating pad with the setting on low. ? On an inflatable donut-shaped cushion.  If directed, put ice on the affected area: ? Put ice in a plastic bag. ? Place  a towel between your skin and the bag. ? Leave the ice on for 20 minutes, 2-3 times a day.   General instructions  Follow any diet instructions given by your health care provider.  Keep all follow-up visits as told by your health care provider. This is important. Contact a health care provider if you have:  Bleeding from your incision.  Pain, swelling, or redness that does not improve or gets worse.  Trouble passing stool or  urine.  Symptoms that return after treatment. Get help right away if you:  Have problems moving or using your legs.  Have severe or increasing pain.  Have swelling in the affected area that suddenly gets worse.  Have a large increase in bleeding or passing of pus.  Develop chills or a fever. Summary  An anorectal abscess is an abscess that is near the opening of the anus or around the rectum. An abscess is an infected area that contains a collection of pus.  The main symptom of this condition is pain. It may be a throbbing pain that gets worse during bowel movements.  Treatment for an anorectal abscess may include surgery to drain the pus from the abscess. Medicines and sitz baths may also be a part of your treatment plan. This information is not intended to replace advice given to you by your health care provider. Make sure you discuss any questions you have with your health care provider. Document Revised: 04/23/2017 Document Reviewed: 04/23/2017 Elsevier Patient Education  2021 [employer].

## 2020-05-09 ENCOUNTER — Ambulatory Visit (INDEPENDENT_AMBULATORY_CARE_PROVIDER_SITE_OTHER): Payer: Commercial Managed Care - PPO | Admitting: Family Medicine

## 2020-05-09 ENCOUNTER — Encounter: Payer: Self-pay | Admitting: Family Medicine

## 2020-05-09 VITALS — BP 102/68 | HR 74 | Ht 70.0 in | Wt 208.0 lb

## 2020-05-09 DIAGNOSIS — K610 Anal abscess: Secondary | ICD-10-CM

## 2020-05-09 NOTE — Patient Instructions (Signed)
Continue with salt water tub soaks once or twice daily for next few days  Let me know if you have any recurrent pain, fever, or increased redness.

## 2020-05-09 NOTE — Progress Notes (Signed)
Established Patient Office Visit  Subjective:  Patient ID: Glen Cain, male    DOB: 04-05-1985  Age: 35 y.o. MRN: [id number]  CC:  Chief Complaint  Patient presents with  . Follow-up    HPI [name] presents for follow-up from visit yesterday for incision and drainage of perianal abscess.  Feels much better today.  Much less pain.  He did have bowel movement last night and packing came out then.  He plans to start doing some warm water soaks tonight.  No fever.  Able to sit the day without much discomfort.  Past Medical History:  Diagnosis Date  . Benign neoplasm of skin, site unspecified 08/22/2009    Past Surgical History:  Procedure Laterality Date  . ARTHROSCOPIC REPAIR ACL Right 07  . CHOLECYSTECTOMY  12/15  . KNEE ARTHROSCOPY Right 06/16/2013   Procedure: RIGHT ARTHROSCOPY KNEE MENISECTOMY WITH DEBRIDEMENT ;  Surgeon: Marin Shutter, MD;  Location: [facility];  Service: Orthopedics;  Laterality: Right;  . WISDOM TOOTH EXTRACTION      Family History  Problem Relation Age of Onset  . Diabetes Other        fhx  . Hypertension Other        fhx  . Cancer Other        fhx  . Stroke Other        fhx    Social History   Socioeconomic History  . Marital status: Married    Spouse name: Not on file  . Number of children: Not on file  . Years of education: Not on file  . Highest education level: Not on file  Occupational History  . Not on file  Tobacco Use  . Smoking status: Former Smoker    Types: Cigarettes    Quit date: 06/14/2009    Years since quitting: 10.9  . Smokeless tobacco: Never Used  . Tobacco comment: occ  Vaping Use  . Vaping Use: Never used  Substance and Sexual Activity  . Alcohol use: Yes    Alcohol/week: 6.0 standard drinks    Types: 6 Cans of beer per week  . Drug use: No  . Sexual activity: Not on file  Other Topics Concern  . Not on file  Social History Narrative  . Not on file   Social Determinants of Health   Financial  Resource Strain: Not on file  Food Insecurity: Not on file  Transportation Needs: Not on file  Physical Activity: Not on file  Stress: Not on file  Social Connections: Not on file  Intimate Partner Violence: Not on file    Outpatient Medications Prior to Visit  Medication Sig Dispense Refill  . amoxicillin-clavulanate (AUGMENTIN) 875-125 MG tablet Take 1 tablet by mouth 3 (three) times daily. 30 tablet 0  . hydrocortisone-pramoxine (PROCTOFOAM-HC) rectal foam Place 1 applicator rectally 2 (two) times daily. 10 g 1  . Multiple Vitamin (MULTIVITAMIN) tablet Take 1 tablet by mouth daily.     No facility-administered medications prior to visit.    No Known Allergies  ROS Review of Systems  Constitutional: Negative for chills and fever.      Objective:    Physical Exam Vitals reviewed.  Constitutional:      Appearance: Normal appearance.  Genitourinary:    Comments: We examine wound from incision and drainage yesterday of perianal abscess.  Packing is out completely.  He has no significant erythema.  No fluctuance.  Did have a very small amount of purulence that  came out of the wound cavity with pressing around the edges.  Still slightly tender to palpation but overall much improved. Neurological:     Mental Status: He is alert.     BP 102/68   Pulse 74   Ht 5' 10"  (1.778 m)   Wt 208 lb (94.3 kg)   SpO2 98%   BMI 29.84 kg/m  Wt Readings from Last 3 Encounters:  05/09/20 208 lb (94.3 kg)  05/08/20 210 lb 4.8 oz (95.4 kg)  05/04/20 209 lb (94.8 kg)     Health Maintenance Due  Topic Date Due  . COVID-19 Vaccine (3 - Pfizer risk 4-dose series) 07/28/2019    There are no preventive care reminders to display for this patient.  Lab Results  Component Value Date   TSH 1.59 06/08/2019   Lab Results  Component Value Date   WBC 8.1 06/08/2019   HGB 14.9 06/08/2019   HCT 42.1 06/08/2019   MCV 87.9 06/08/2019   PLT 232.0 06/08/2019   Lab Results  Component Value  Date   NA 140 06/08/2019   K 4.1 06/08/2019   CO2 27 06/08/2019   GLUCOSE 91 06/08/2019   BUN 11 06/08/2019   CREATININE 0.92 06/08/2019   BILITOT 0.7 06/08/2019   ALKPHOS 62 06/08/2019   AST 16 06/08/2019   ALT 18 06/08/2019   PROT 6.9 06/08/2019   ALBUMIN 4.2 06/08/2019   CALCIUM 9.2 06/08/2019   GFR 93.23 06/08/2019   Lab Results  Component Value Date   CHOL 122 06/08/2019   Lab Results  Component Value Date   HDL 31.70 (L) 06/08/2019   Lab Results  Component Value Date   LDLCALC 71 06/08/2019   Lab Results  Component Value Date   TRIG 97.0 06/08/2019   Lab Results  Component Value Date   CHOLHDL 4 06/08/2019   No results found for: HGBA1C    Assessment & Plan:   Problem List Items Addressed This Visit   None   Visit Diagnoses    Perianal abscess    -  Primary    Improved following incision and drainage yesterday.  Packing is out at this time.  We strongly advised he do some salt water soaks once or twice daily for the next few days.  Follow-up promptly for any recurrent pain, fever, or other concerns. Hopefully, no deeper fistula tracts.  We could not determine any deep pockets with exploring wound cavity yesterday with curved hemostats.  No orders of the defined types were placed in this encounter.   Follow-up: No follow-ups on file.    Carolann Littler, MD

## 2020-05-31 ENCOUNTER — Telehealth: Payer: Self-pay | Admitting: Family Medicine

## 2020-05-31 NOTE — Telephone Encounter (Signed)
   Pt has a  perianal abscess; he still has some drainage. Pt would like to know if he should schedule another office visit or refer to a specialist. pt would like a call to advise.

## 2020-06-01 ENCOUNTER — Telehealth: Payer: Self-pay

## 2020-06-01 NOTE — Telephone Encounter (Signed)
Informed patient of referral.

## 2020-06-01 NOTE — Telephone Encounter (Signed)
I would go ahead and set up referral to general surgery.  He could potentially have a fistula or more complicating feature.

## 2020-06-01 NOTE — Telephone Encounter (Signed)
Patient would like to know if he should be on an antibiotic for the drainage?

## 2020-06-01 NOTE — Telephone Encounter (Signed)
Left message informing patient of Dr Elease Hashimoto message.

## 2020-06-01 NOTE — Telephone Encounter (Signed)
If this is draining I would not recommend antibiotic at this time.  Would continue warm sitz baths daily to help facilitate drainage

## 2020-06-04 ENCOUNTER — Other Ambulatory Visit: Payer: Self-pay

## 2020-06-04 DIAGNOSIS — L988 Other specified disorders of the skin and subcutaneous tissue: Secondary | ICD-10-CM

## 2020-06-04 NOTE — Telephone Encounter (Signed)
Pt called to check to the status of his referral , gave him the name of the place to where  the referral was sent to and phone. Pt is have a lot of pain and was going out town.

## 2020-06-04 NOTE — Telephone Encounter (Signed)
Patient is calling and is requesting a call back regarding referral, please advise. CB is [phone]

## 2020-06-05 NOTE — Telephone Encounter (Signed)
[facility] contacted the patient and their first available appointment is not till April 4th.  He's wanting to know if there is some where else that he can be referred to so he can be seen sooner. If not what does he need to do in the mean time?  Please advise

## 2020-06-05 NOTE — Telephone Encounter (Signed)
Unfortunately, [facility] is the only surgical group in town.  Only other surgical option would be to look outside of [place] to perhaps [facility] or [facility] or [facility].  I do not specifically know any of the surgeons in those groups but while we check with Neoma Laming to see if she can get him connected to get in sooner at another group.

## 2020-06-06 ENCOUNTER — Other Ambulatory Visit: Payer: Self-pay

## 2020-06-06 MED ORDER — METRONIDAZOLE 500 MG PO TABS: 500.0000 mg | ORAL_TABLET | Freq: Three times a day (TID) | ORAL | 0 refills | Status: DC

## 2020-06-06 MED ORDER — CIPROFLOXACIN HCL 500 MG PO TABS: 500.0000 mg | ORAL_TABLET | Freq: Two times a day (BID) | ORAL | 0 refills | Status: DC

## 2020-06-06 NOTE — Telephone Encounter (Signed)
Confirm no drug allergies.  We could send in combination therapy with Cipro 500 mg twice daily for 10 days and Flagyl 500 mg 3 times daily for 10 days.  For infections in this region we have to cover for both gram-negative rods and anaerobic organisms.  Would also definitely continue with warm sitz bath as to facilitate drainage.  Also would caution not to combine alcohol use with Flagyl because of potential for increased nausea

## 2020-06-06 NOTE — Telephone Encounter (Signed)
Spoke with pt about message. Voiced understanding.  He want to know should he take a stronger antibiotic than Amoxicillin.  Area is still draining also he still has pain everyday.  Please advise

## 2020-06-06 NOTE — Telephone Encounter (Addendum)
Called and spoke with patient she doesn't any reaction , to antibiotic pt wants to try this , discuss direction of both medication, pt is out town  to [place] and wants this  sent to  [facility] [place] [address] [phone], sent rx  To pharmacy per patient request.

## 2020-06-07 NOTE — Telephone Encounter (Signed)
Pt is calling to see if he can get a call back to give him instructions on how he is suppose to be taking the new medications ciprofloxacin (CIPRO) 500 MG and metronidazole (FLAGYL) 500 MG so that he will have a clearer understanding of what he is suppose to be doing.

## 2020-06-07 NOTE — Telephone Encounter (Signed)
I am confused by the question.  The antibiotics are Cipro 500 mg ONE TWICE DAILY (one every 12 hours) and Flagyl 500 mg ONE TID (ONE EVERY 8 HOURS.)  Were they not labeled by pharmacy?   Didn't know if I was missing something.   Prescriptions were sent in with explicit instructions.  They can both be taken with food.  Was there another question?

## 2020-06-07 NOTE — Telephone Encounter (Signed)
Spoke with patient, discuss below message, he verbalize understanding.

## 2020-06-15 ENCOUNTER — Telehealth: Payer: Self-pay | Admitting: Family Medicine

## 2020-06-15 NOTE — Telephone Encounter (Signed)
I called the patient to inform him that I would send his referral to [facility].  Pt stated  that he would keep his appointment with [facility]. I faxed notes and referral to [facility] [place]

## 2020-06-15 NOTE — Telephone Encounter (Signed)
Pt is calling in stating that the referral to [facility] at 07/02/2020 and now he would like to have a referral sent to [facility] 336 [phone] [facility] (F) and he has an appointment scheduled on 06/21/2020 (Thursday).  Pt would like to have a call back.

## 2020-07-02 ENCOUNTER — Ambulatory Visit: Payer: Self-pay | Admitting: Surgery

## 2020-07-02 NOTE — H&P (Signed)
[place] Appointment: 07/02/2020 10:15 AM Location: [facility] Patient #: [id number] DOB: Nov 04, 1985 Married / Language: Cleophus Molt / Race: White Male  History of Present Illness Adin Hector MD; 07/02/2020 1:32 PM) The patient is a 35 year old male who presents with a complaint of anal problems. Note for "Anal problems": ` ` ` Patient sent for surgical consultation at the request of Dr Elease Hashimoto  Chief Complaint: Anal pain and drainage. Possible fistula versus atypical hemorrhoid ` ` The patient is a young male whose had some mild hemorrhoid issues in the past. However in late January had episode of worsening anal pain. Went to his primary care office. Suspect perhaps a thrombosed hemorrhoid but it did not improve. More suspicious for abscess. Incision and drainage in the office February 7. Placed on antibiotics. Seem to feel better but then had recurrent swelling and discomfort. A suspected. Surgical consultation offered. Appointment delayed a few weeks, so he went to [facility] surgical. I suspected infected hemorrhoid and recommended Augmentin. Try some Proctofoam. He can get that filled. Patient notes he's been on Cipro and Augmentin antibiotics intermittently for over a month. He moves his bowels once or twice a day. Does not smoke. No diabetes. No sleep apnea. No blood thinners. No history of Crohn's also colitis or inflammatory bowel disease. No prior hemorrhoid issues in the past. He does recall having pain abscess lanced on his lower back and told it was a pilonidal abscess. Was followed impact and resolve nonoperatively 2012.  He decided to keep the appointment with [place] since he started having pain and swelling through the weekend. He notes he's having yellowish drainage. Somewhat uncomfortable to sit. No personal nor family history of GI/colon cancer, inflammatory bowel disease, irritable bowel syndrome, allergy such as Celiac Sprue, dietary/dairy  problems, colitis, ulcers nor gastritis. No recent sick contacts/gastroenteritis. No travel outside the country. No changes in diet. No dysphagia to solids or liquids. No significant heartburn or reflux. No melena, hematemesis, coffee ground emesis. No evidence of prior gastric/peptic ulceration.  (Review of systems as stated in this history (HPI) or in the review of systems. Otherwise all other 12 point ROS are negative) ` ` ###########################################`  This patient encounter took 30 minutes today to perform the following: obtain history, perform exam, review outside records, interpret tests & imaging, counsel the patient on their diagnosis; and, document this encounter, including findings & plan in the electronic health record (EHR).   Past Surgical History [name], CNA; 07/02/2020 10:05 AM) Gallbladder Surgery - Laparoscopic Knee Surgery Right. Oral Surgery  Diagnostic Studies History [name], CNA; 07/02/2020 10:05 AM) Colonoscopy never  Allergies [name], CNA; 07/02/2020 10:05 AM) No Known Drug Allergies [01/31/2014]: Allergies Reconciled  Medication History [name], CNA; 07/02/2020 10:05 AM) Amoxicillin-Pot Clavulanate (875-125MG Tablet, Oral) Active. Ciprofloxacin HCl (500MG Tablet, Oral) Active. Hydrocortisone (Perianal) (2.5% Cream, External) Active. metroNIDAZOLE (500MG Tablet, Oral) Active. Pramoxine HCl (Perianal) (1% Foam, External) Active. Multivitamins (Oral) Active. Medications Reconciled  Social History [name], CNA; 07/02/2020 10:05 AM) Alcohol use Moderate alcohol use, Occasional alcohol use. Caffeine use Carbonated beverages, Tea. No drug use Tobacco use Former smoker.  Family History [name], CNA; 07/02/2020 10:05 AM) Hypertension Brother, Father, Mother.  Other Problems [name], CNA; 07/02/2020 10:05 AM) Hemorrhoids     Review of Systems Adriana Reams [place] CNA;  07/02/2020 10:05 AM) General Present- Chills and Night Sweats. Not Present- Appetite Loss, Fatigue, Fever, Weight Gain and Weight Loss. Skin Present- New Lesions. Not Present- Change  in Wart/Mole, Dryness, Hives, Jaundice, Non-Healing Wounds, Rash and Ulcer. HEENT Present- Wears glasses/contact lenses. Not Present- Earache, Hearing Loss, Hoarseness, Nose Bleed, Oral Ulcers, Ringing in the Ears, Seasonal Allergies, Sinus Pain, Sore Throat, Visual Disturbances and Yellow Eyes. Respiratory Not Present- Bloody sputum, Chronic Cough, Difficulty Breathing, Snoring and Wheezing. Breast Not Present- Breast Mass, Breast Pain, Nipple Discharge and Skin Changes. Cardiovascular Not Present- Chest Pain, Difficulty Breathing Lying Down, Leg Cramps, Palpitations, Rapid Heart Rate, Shortness of Breath and Swelling of Extremities. Gastrointestinal Present- Hemorrhoids and Rectal Pain. Not Present- Abdominal Pain, Bloating, Bloody Stool, Change in Bowel Habits, Chronic diarrhea, Constipation, Difficulty Swallowing, Excessive gas, Gets full quickly at meals, Indigestion, Nausea and Vomiting. Male Genitourinary Present- Frequency. Not Present- Blood in Urine, Change in Urinary Stream, Impotence, Nocturia, Painful Urination, Urgency and Urine Leakage. Musculoskeletal Not Present- Back Pain, Joint Pain, Joint Stiffness, Muscle Pain, Muscle Weakness and Swelling of Extremities. Neurological Present- Headaches. Not Present- Decreased Memory, Fainting, Numbness, Seizures, Tingling, Tremor, Trouble walking and Weakness. Psychiatric Present- Change in Sleep Pattern. Not Present- Anxiety, Bipolar, Depression, Fearful and Frequent crying. Endocrine Not Present- Cold Intolerance, Excessive Hunger, Hair Changes, Heat Intolerance, Hot flashes and New Diabetes. Hematology Not Present- Blood Thinners, Easy Bruising, Excessive bleeding, Gland problems, HIV and Persistent Infections.  Vitals (Donyelle Alston CNA; 07/02/2020 10:06  AM) 07/02/2020 10:05 AM Weight: 205.5 lb Height: 70in Body Surface Area: 2.11 m Body Mass Index: 29.49 kg/m  Temp.: 97.43F  Pulse: 94 (Regular)  P.OX: 96% (Room air) BP: 120/70(Sitting, Left Arm, Standard)        Physical Exam Adin Hector MD; 07/02/2020 1:33 PM)  General Mental Status-Alert. General Appearance-Not in acute distress, Not Sickly. Orientation-Oriented X3. Hydration-Well hydrated. Voice-Normal.  Integumentary Global Assessment Upon inspection and palpation of skin surfaces of the - Axillae: non-tender, no inflammation or ulceration, no drainage. and Distribution of scalp and body hair is normal. General Characteristics Temperature - normal warmth is noted.  Head and Neck Head-normocephalic, atraumatic with no lesions or palpable masses. Face Global Assessment - atraumatic, no absence of expression. Neck Global Assessment - no abnormal movements, no bruit auscultated on the right, no bruit auscultated on the left, no decreased range of motion, non-tender. Trachea-midline. Thyroid Gland Characteristics - non-tender.  Eye Eyeball - Left-Extraocular movements intact, No Nystagmus - Left. Eyeball - Right-Extraocular movements intact, No Nystagmus - Right. Cornea - Left-No Hazy - Left. Cornea - Right-No Hazy - Right. Sclera/Conjunctiva - Left-No scleral icterus, No Discharge - Left. Sclera/Conjunctiva - Right-No scleral icterus, No Discharge - Right. Pupil - Left-Direct reaction to light normal. Pupil - Right-Direct reaction to light normal.  ENMT Ears Pinna - Left - no drainage observed, no generalized tenderness observed. Pinna - Right - no drainage observed, no generalized tenderness observed. Nose and Sinuses External Inspection of the Nose - no destructive lesion observed. Inspection of the nares - Left - quiet respiration. Inspection of the nares - Right - quiet respiration. Mouth and Throat Lips - Upper  Lip - no fissures observed, no pallor noted. Lower Lip - no fissures observed, no pallor noted. Nasopharynx - no discharge present. Oral Cavity/Oropharynx - Tongue - no dryness observed. Oral Mucosa - no cyanosis observed. Hypopharynx - no evidence of airway distress observed.  Chest and Lung Exam Inspection Movements - Normal and Symmetrical. Accessory muscles - No use of accessory muscles in breathing. Palpation Palpation of the chest reveals - Non-tender. Auscultation Breath sounds - Normal and Clear.  Cardiovascular Auscultation Rhythm - Regular. Murmurs &  Other Heart Sounds - Auscultation of the heart reveals - No Murmurs and No Systolic Clicks.  Abdomen Inspection Inspection of the abdomen reveals - No Visible peristalsis and No Abnormal pulsations. Umbilicus - No Bleeding, No Urine drainage. Palpation/Percussion Palpation and Percussion of the abdomen reveal - Soft, Non Tender, No Rebound tenderness, No Rigidity (guarding) and No Cutaneous hyperesthesia. Note: Abdomen soft. Nontender. Not distended. Small umbilical hernia at the base. No other incisional hernias. No guarding.  Male Genitourinary Sexual Maturity Tanner 5 - Adult hair pattern and Adult penile size and shape. Note: No inguinal hernias. Normal external genitalia. Epididymi, testes, and spermatic cords normal without any masses.  Rectal Note: Right anterior midline external hemorrhoid with draining sinus. Spontaneous flatus and drainage. Rather tender and sensitive. Highly suspicious for fistula going to external hemorrhoid. Some fullness of the midline raphae. Overall tender but I feel no deeper fluctuance or abscess.  Mildly increased sphincter tone. No posterior midline fissure. No evidence of any other abscesses or sinuses elsewhere. I held off on internal exam. He does have one or 2 small pits consistent with mild pilonidal disease but no active pilonidal abscess. No condyloma.  Peripheral  Vascular Upper Extremity Inspection - Left - No Cyanotic nailbeds - Left, Not Ischemic. Inspection - Right - No Cyanotic nailbeds - Right, Not Ischemic.  Neurologic Neurologic evaluation reveals -normal attention span and ability to concentrate, able to name objects and repeat phrases. Appropriate fund of knowledge , normal sensation and normal coordination. Mental Status Affect - not angry, not paranoid. Cranial Nerves-Normal Bilaterally. Gait-Normal.  Neuropsychiatric Mental status exam performed with findings of-able to articulate well with normal speech/language, rate, volume and coherence, thought content normal with ability to perform basic computations and apply abstract reasoning and no evidence of hallucinations, delusions, obsessions or homicidal/suicidal ideation.  Musculoskeletal Global Assessment Spine, Ribs and Pelvis - no instability, subluxation or laxity. Right Upper Extremity - no instability, subluxation or laxity.  Lymphatic Head & Neck  General Head & Neck Lymphatics: Bilateral - Description - No Localized lymphadenopathy. Axillary  General Axillary Region: Bilateral - Description - No Localized lymphadenopathy. Femoral & Inguinal  Generalized Femoral & Inguinal Lymphatics: Left - Description - No Localized lymphadenopathy. Right - Description - No Localized lymphadenopathy.    Assessment & Plan Adin Hector MD; 07/02/2020 10:44 AM)  ANAL FISTULA (K60.3) Impression: Irritated external hemorrhoid perhaps thrombosed versus abscess status post incision and drainage with persistent drainage for the past month suspicious for fistula.  I think this is a fistula since it has not been able to heal appropriately for the past 6 weeks. I think he requires operative exploration with unroofing versus lift repair. He may require to surgeries with unroofing and seton and then a more formal repair in the future. We will see.  Try and hold off on reviewing  antibiotics since things have calmed down. If he gets recurrent symptoms, he may need to stay on Augmentin.  Because this is been going on for a while, we'll try and get this done more urgently since I don't think this can wait a few more months.  Current Plans You are being scheduled for surgery- Our schedulers will call you.  You should hear from our office's scheduling department within 5 working days about the location, date, and time of surgery. We try to make accommodations for patient's preferences in scheduling surgery, but sometimes the OR schedule or the surgeon's schedule prevents [place] from making those accommodations.  If you have not  heard from our office [phone]) in 5 working days, call the office and ask for your surgeon's nurse.  If you have other questions about your diagnosis, plan, or surgery, call the office and ask for your surgeon's nurse.  Pt Education - [facility] Abscess/Fistula (AT): discussed with patient and provided information. The anatomy & physiology of the anorectal region was discussed. We discussed the pathophysiology of anorectal abscess and fistula. Differential diagnosis was discussed. Natural history progression was discussed. I stressed the importance of a bowel regimen to have daily soft bowel movements to minimize progression of disease.  The patient's condition is not adequately controlled. Non-operative treatment has not healed the fistula. Therefore, I recommended examination under anaesthesia to confirm the diagnosis and treat the fistula. I discussed techniques that may be required such as fistulotomy, ligation by LIFT technique, and/or seton placement. Benefits & alternatives discussed. I noted a good likelihood this will help address the problem, but sometimes repeat operations and prolonged healing times may occur. Risks such as bleeding, pain, recurrence, reoperation, incontinence, heart attack, death, and other risks were  discussed.  Educational handouts further explaining the pathology, treatment options, and bowel regimen were given. The patient expressed understanding & wishes to proceed. We will work to coordinate surgery for a mutually convenient time.   ENCOUNTER FOR PREOPERATIVE EXAMINATION FOR GENERAL SURGICAL PROCEDURE (Z01.818)  Current Plans You are being scheduled for surgery- Our schedulers will call you.  You should hear from our office's scheduling department within 5 working days about the location, date, and time of surgery. We try to make accommodations for patient's preferences in scheduling surgery, but sometimes the OR schedule or the surgeon's schedule prevents [place] from making those accommodations.  If you have not heard from our office [phone]) in 5 working days, call the office and ask for your surgeon's nurse.  If you have other questions about your diagnosis, plan, or surgery, call the office and ask for your surgeon's nurse.  Pt Education - [facility] Rectal Prep for Anorectal outpatient/office surgery: discussed with patient and provided information. Pt Education - [facility] Rectal Surgery HCI (Horrace Hanak): discussed with patient and provided information. Pt Education - [facility] Good Bowel Health (Matia Zelada)  Adin Hector, MD, FACS, MASCRS  Gastrointestinal and Minimally Invasive Surgery  [facility] [address] [place], [place] [address] [phone] Fax [phone] Main/Paging  CONTACT INFORMATION: Weekday (9AM-5PM) concerns: Call [facility] main office at [phone] Weeknight (5PM-9AM) or Weekend/Holiday concerns: Check [email or web address] for General Surgery [facility] coverage (Please, do not use SecureChat as it is not reliable communication to operating surgeons for immediate patient care)

## 2020-07-17 ENCOUNTER — Telehealth: Payer: Self-pay | Admitting: Emergency Medicine

## 2020-07-17 ENCOUNTER — Encounter: Payer: Self-pay | Admitting: Emergency Medicine

## 2020-07-17 ENCOUNTER — Emergency Department (INDEPENDENT_AMBULATORY_CARE_PROVIDER_SITE_OTHER)
Admission: EM | Admit: 2020-07-17 | Discharge: 2020-07-17 | Disposition: A | Discharge status: Home / Self Care | Payer: Commercial Managed Care - PPO | Source: Home / Self Care | Attending: Family Medicine | Admitting: Family Medicine

## 2020-07-17 ENCOUNTER — Other Ambulatory Visit: Payer: Self-pay

## 2020-07-17 DIAGNOSIS — R197 Diarrhea, unspecified: Secondary | ICD-10-CM | POA: Diagnosis not present

## 2020-07-17 MED ORDER — VANCOMYCIN HCL 125 MG PO CAPS: 125.0000 mg | ORAL_CAPSULE | Freq: Four times a day (QID) | ORAL | 0 refills | Status: DC

## 2020-07-17 NOTE — Telephone Encounter (Signed)
Call to [facility] to confirm lab order # [id number]- original order did not have an order # - confirmed w/ [facility] rep that specimen should be placed in a sterile specimen cup & frozen prior to transport - done by RN. Dr Meda Coffee updated

## 2020-07-17 NOTE — Discharge Instructions (Addendum)
Continue to drink plenty of water and fluids Bland diet  Check my chart for test results Take the vancomycin if the c diff is positive

## 2020-07-17 NOTE — ED Notes (Signed)
Pt given instructions on how to collect a stool sample - pt to return w/ sample

## 2020-07-17 NOTE — ED Triage Notes (Signed)
Patient states that he's been on a lot of antibiotics and fears that he has developed C-Diff.  He has had several watery diarrhea episodes since Saturday night.  Patient did vomit once.  No fever.  Not taking any probiotics just OTC MVI.

## 2020-07-17 NOTE — ED Provider Notes (Signed)
[facility]    CSN: [id number] Arrival date & time: 07/17/20  0803      History   Chief Complaint Chief Complaint  Patient presents with  . Possible C-Diff    HPI MAEJOR ERVEN is a 35 y.o. male.   HPI   Pleasant 35 year old gentleman who usually enjoys good health.  In the last couple of months has had a little trouble with a rectal abscess, rectal fissure, and hemorrhoids.  He is scheduled for surgery next month.  Because of his recurring problems with pain and infection he was treated with amoxicillin, Augmentin, Cipro, and metronidazole.  Now he has developed diarrhea.  He states it is watery.  Smells bad.  Up to 10 stools a day.  He is having crampy abdominal pain when the waves of diarrhea hit.  He is trying to keep down of water.  He had lunch on Sunday, bowl of soup yesterday, no other food for the last 3 days.  He is drinking water.  He states he has had some Pedialyte as well.  No lightheadedness or dizziness.  No fever or chills.  No nausea or vomiting.  Past Medical History:  Diagnosis Date  . Benign neoplasm of skin, site unspecified 08/22/2009    Patient Active Problem List   Diagnosis Date Noted  . 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 UNSPECIFIED DISORDER OF SKIN&SUBCUTANEOUS TISSUE 10/18/2009  . Benign neoplasm of skin, site unspecified 08/22/2009    Past Surgical History:  Procedure Laterality Date  . ARTHROSCOPIC REPAIR ACL Right 07  . CHOLECYSTECTOMY  12/15  . KNEE ARTHROSCOPY Right 06/16/2013   Procedure: RIGHT ARTHROSCOPY KNEE MENISECTOMY WITH DEBRIDEMENT ;  Surgeon: Marin Shutter, MD;  Location: [facility];  Service: Orthopedics;  Laterality: Right;  . WISDOM TOOTH EXTRACTION         Home Medications    Prior to Admission medications   Medication Sig Start Date End Date Taking? Authorizing Provider  Multiple Vitamin (MULTIVITAMIN) tablet Take 1 tablet by mouth daily.   Yes [provider]  vancomycin (VANCOCIN  HCL) 125 MG capsule Take 1 capsule (125 mg total) by mouth 4 (four) times daily. 07/17/20  Yes Raylene Everts, MD    Family History Family History  Problem Relation Age of Onset  . Diabetes Other        fhx  . Hypertension Other        fhx  . Cancer Other        fhx  . Stroke Other        fhx    Social History Social History   Tobacco Use  . Smoking status: Former Smoker    Types: Cigarettes    Quit date: 06/14/2009    Years since quitting: 11.0  . Smokeless tobacco: Never Used  . Tobacco comment: occ  Vaping Use  . Vaping Use: Never used  Substance Use Topics  . Alcohol use: Yes    Alcohol/week: 6.0 standard drinks    Types: 6 Cans of beer per week  . Drug use: No     Allergies   Patient has no known allergies.   Review of Systems Review of Systems  See HPI Physical Exam Triage Vital Signs ED Triage Vitals  Enc Vitals Group     BP 07/17/20 0818 119/80     Pulse Rate 07/17/20 0818 (!) 109     Resp 07/17/20 0818 18     Temp 07/17/20 0818 99  F (37.2 C)     Temp Source 07/17/20 0818 Oral     SpO2 07/17/20 0818 97 %     Weight --      Height --      Head Circumference --      Peak Flow --      Pain Score 07/17/20 0819 8     Pain Loc --      Pain Edu? --      Excl. in [place]? --    No data found.  Updated Vital Signs BP 119/80 (BP Location: Left Arm)   Pulse (!) 109   Temp 99 F (37.2 C) (Oral)   Resp 18   SpO2 97%      Physical Exam Constitutional:      General: He is not in acute distress.    Appearance: Normal appearance. He is well-developed and normal weight.  HENT:     Head: Normocephalic and atraumatic.     Nose:     Comments: Mask is in place Eyes:     Conjunctiva/sclera: Conjunctivae normal.     Pupils: Pupils are equal, round, and reactive to light.  Cardiovascular:     Rate and Rhythm: Normal rate.  Pulmonary:     Effort: Pulmonary effort is normal. No respiratory distress.  Abdominal:     General: Abdomen is flat. Bowel  sounds are normal. There is no distension.     Palpations: Abdomen is soft. There is no mass.     Tenderness: There is no abdominal tenderness. There is no guarding or rebound.     Comments: Active bowel sounds.  Soft and nontender.  No mass or organomegaly.  Musculoskeletal:        General: Normal range of motion.     Cervical back: Normal range of motion.  Skin:    General: Skin is warm and dry.  Neurological:     Mental Status: He is alert.  Psychiatric:        Behavior: Behavior normal.      UC Treatments / Results  Labs (all labs ordered are listed, but only abnormal results are displayed) Labs Reviewed  CLOSTRIDIUM DIFFICILE BY PCR    EKG   Radiology No results found.  Procedures Procedures (including critical care time)  Medications Ordered in UC Medications - No data to display  Initial Impression / Assessment and Plan / UC Course  I have reviewed the triage vital signs and the nursing notes.  Pertinent labs & imaging results that were available during my care of the patient were reviewed by me and considered in my medical decision making (see chart for details).      Possible C. difficile.  Patient is told to continue pushing fluids.  Bland diet.  I gave him a written prescription for vancomycin that he can fill and take if his C. difficile test is positive.  He can check MyChart for test results.  Good handwashing and prevention of spread was reviewed Final Clinical Impressions(s) / UC Diagnoses   Final diagnoses:  Diarrhea of presumed infectious origin     Discharge Instructions     Continue to drink plenty of water and fluids Bland diet  Check my chart for test results Take the vancomycin if the c diff is positive   ED Prescriptions    Medication Sig Dispense Auth. Provider   vancomycin (VANCOCIN HCL) 125 MG capsule Take 1 capsule (125 mg total) by mouth 4 (four) times daily. 40 capsule Blanchie Serve  Collie Siad, MD     PDMP not reviewed this  encounter.   Raylene Everts, MD 07/17/20 [phone]

## 2020-07-19 ENCOUNTER — Telehealth: Payer: Self-pay | Admitting: Emergency Medicine

## 2020-07-19 NOTE — Telephone Encounter (Signed)
Call back to [place] who had also spoken w/ his surgeon- EUA would be delayed if C-Diff was positive- pt wants to wait to start vancomycin until retest is resulted. Pt verbalized an understanding that vancomyin needed to be started ASAP if test results come back positive. Chart & labs reviewed w/ provider Kirkland Hun, NP) here today.

## 2020-07-20 LAB — C. DIFFICILE GDH AND TOXIN A/B
GDH ANTIGEN: DETECTED
MICRO NUMBER:: 11787056
SPECIMEN QUALITY:: ADEQUATE
TOXIN A AND B: NOT DETECTED

## 2020-07-20 LAB — CLOSTRIDIUM DIFFICILE TOXIN B, QUALITATIVE, REAL-TIME PCR: Toxigenic C. Difficile by PCR: NOT DETECTED

## 2020-07-23 ENCOUNTER — Telehealth: Payer: Self-pay | Admitting: Family Medicine

## 2020-07-23 DIAGNOSIS — R109 Unspecified abdominal pain: Secondary | ICD-10-CM

## 2020-07-23 NOTE — Telephone Encounter (Signed)
The patient wants to know if Dr. Elease Hashimoto can place a referral to have a GI work up.   The patient went to the ED 07/17/2020  The patient has 2 cdiff tests last week and they both came back negative  He is having abdominal pain, nausea, diarrhea, night sweats, no fever, and has chills.   No COVID test  Patient is having surgery on May 19 and his surgeon recommended him having a GI work up before the surgery with the symptoms.  Please advise

## 2020-07-23 NOTE — Telephone Encounter (Signed)
Go ahead and set up referral to [facility] unless patient has another preference

## 2020-07-23 NOTE — Addendum Note (Signed)
Addended by: Rebecca Eaton on: 07/23/2020 02:06 PM   Modules accepted: Orders

## 2020-08-08 NOTE — Progress Notes (Addendum)
DUE TO COVID-19 ONLY ONE VISITOR IS ALLOWED TO COME WITH YOU AND STAY IN THE WAITING ROOM ONLY DURING PRE OP AND PROCEDURE DAY OF SURGERY. THE 1 VISITOR  MAY VISIT WITH YOU AFTER SURGERY IN YOUR PRIVATE ROOM DURING VISITING HOURS ONLY!  YOU NEED TO HAVE A COVID 19 TEST ON_______ @_______ , THIS TEST MUST BE DONE BEFORE SURGERY,  COVID TESTING SITE [address] [place] [address], IT IS ON THE RIGHT GOING OUT [address] APPROXIMATELY  2 MINUTES PAST [employer] ON THE RIGHT. ONCE YOUR COVID TEST IS COMPLETED,  PLEASE BEGIN THE QUARANTINE INSTRUCTIONS AS OUTLINED IN YOUR HANDOUT.                Glen Cain  08/08/2020   Your procedure is scheduled on:                       08/16/2020   Report to [facility] Main  Entrance   Report to admitting at    0730am      Call this number if you have problems the morning of surgery [phone]    REMEMBER: NO  SOLID FOOD CANDY OR GUM AFTER MIDNIGHT. CLEAR LIQUIDS UNTIL    0630am      . NOTHING BY MOUTH EXCEPT CLEAR LIQUIDS UNTIL   0630am    . PLEASE FINISH ENSURE DRINK PER SURGEON ORDER  WHICH NEEDS TO BE COMPLETED AT  0630am     .      CLEAR LIQUID DIET   Foods Allowed                                                                    Coffee and tea, regular and decaf                            Fruit ices (not with fruit pulp)                                      Iced Popsicles                                    Carbonated beverages, regular and diet                                    Cranberry, grape and apple juices Sports drinks like Gatorade Lightly seasoned clear broth or consume(fat free) Sugar, honey syrup ___________________________________________________________________      BRUSH YOUR TEETH MORNING OF SURGERY AND RINSE YOUR MOUTH OUT, NO CHEWING GUM CANDY OR MINTS.     Take these medicines the morning of surgery with A SIP OF WATER:         None  DO NOT TAKE ANY DIABETIC MEDICATIONS DAY OF  YOUR SURGERY  You may not have any metal on your body including hair pins and              piercings  Do not wear jewelry, make-up, lotions, powders or perfumes, deodorant             Do not wear nail polish on your fingernails.  Do not shave  48 hours prior to surgery.              Men may shave face and neck.   Do not bring valuables to the hospital. [place].  Contacts, dentures or bridgework may not be worn into surgery.  Leave suitcase in the car. After surgery it may be brought to your room.     Patients discharged the day of surgery will not be allowed to drive home. IF YOU ARE HAVING SURGERY AND GOING HOME THE SAME DAY, YOU MUST HAVE AN ADULT TO DRIVE YOU HOME AND BE WITH YOU FOR 24 HOURS. YOU MAY GO HOME BY TAXI OR UBER OR ORTHERWISE, BUT AN ADULT MUST ACCOMPANY YOU HOME AND STAY WITH YOU FOR 24 HOURS.  Name and phone number of your driver:  Special Instructions: N/A              Please read over the following fact sheets you were given: _____________________________________________________________________  [facility] - Preparing for Surgery Before surgery, you can play an important role.  Because skin is not sterile, your skin needs to be as free of germs as possible.  You can reduce the number of germs on your skin by washing with CHG (chlorahexidine gluconate) soap before surgery.  CHG is an antiseptic cleaner which kills germs and bonds with the skin to continue killing germs even after washing. Please DO NOT use if you have an allergy to CHG or antibacterial soaps.  If your skin becomes reddened/irritated stop using the CHG and inform your nurse when you arrive at Short Stay. Do not shave (including legs and underarms) for at least 48 hours prior to the first CHG shower.  You may shave your face/neck. Please follow these instructions carefully:  1.  Shower with CHG Soap the night before surgery and  the  morning of Surgery.  2.  If you choose to wash your hair, wash your hair first as usual with your  normal  shampoo.  3.  After you shampoo, rinse your hair and body thoroughly to remove the  shampoo.                           4.  Use CHG as you would any other liquid soap.  You can apply chg directly  to the skin and wash                       Gently with a scrungie or clean washcloth.  5.  Apply the CHG Soap to your body ONLY FROM THE NECK DOWN.   Do not use on face/ open                           Wound or open sores. Avoid contact with eyes, ears mouth and genitals (private parts).  Wash face,  Genitals (private parts) with your normal soap.             6.  Wash thoroughly, paying special attention to the area where your surgery  will be performed.  7.  Thoroughly rinse your body with warm water from the neck down.  8.  DO NOT shower/wash with your normal soap after using and rinsing off  the CHG Soap.                9.  Pat yourself dry with a clean towel.            10.  Wear clean pajamas.            11.  Place clean sheets on your bed the night of your first shower and do not  sleep with pets. Day of Surgery : Do not apply any lotions/deodorants the morning of surgery.  Please wear clean clothes to the hospital/surgery center.  FAILURE TO FOLLOW THESE INSTRUCTIONS MAY RESULT IN THE CANCELLATION OF YOUR SURGERY PATIENT SIGNATURE_________________________________  NURSE SIGNATURE__________________________________  ________________________________________________________________________

## 2020-08-10 ENCOUNTER — Encounter (HOSPITAL_COMMUNITY): Payer: Self-pay

## 2020-08-10 ENCOUNTER — Encounter (HOSPITAL_COMMUNITY)
Admission: RE | Admit: 2020-08-10 | Discharge: 2020-08-10 | Disposition: A | Discharge status: Home / Self Care | Payer: Commercial Managed Care - PPO | Source: Ambulatory Visit | Attending: Surgery | Admitting: Surgery

## 2020-08-10 ENCOUNTER — Other Ambulatory Visit: Payer: Self-pay

## 2020-08-10 DIAGNOSIS — Z01812 Encounter for preprocedural laboratory examination: Secondary | ICD-10-CM | POA: Diagnosis present

## 2020-08-10 LAB — CBC
HCT: 42 % (ref 39.0–52.0)
Hemoglobin: 14.1 g/dL (ref 13.0–17.0)
MCH: 29.7 pg (ref 26.0–34.0)
MCHC: 33.6 g/dL (ref 30.0–36.0)
MCV: 88.6 fL (ref 80.0–100.0)
Platelets: 271 10*3/uL (ref 150–400)
RBC: 4.74 MIL/uL (ref 4.22–5.81)
RDW: 13.2 % (ref 11.5–15.5)
WBC: 8.3 10*3/uL (ref 4.0–10.5)
nRBC: 0 % (ref 0.0–0.2)

## 2020-08-10 NOTE — Progress Notes (Addendum)
Anesthesia Review:  PCP: DR Carolann Littler  Cardiologist : Chest x-ray : EKG : Echo : Stress test: Cardiac Cath :  Activity level: can doa  Flight of stiars without difficulty  Sleep Study/ CPAP : Fasting Blood Sugar :      / Checks Blood Sugar -- times a day:   Blood Thinner/ Instructions /Last Dose: ASA / Instructions/ Last Dose :  covid appt in epic.  No order in MD orders.  Called office of [facility] and spoike with Triage.  Triage to find out from Dr Johney Maine if Covid test is needed on 08/13/2020.  Spoke with pt the afternoon of 08/10/20 and pt reports that [facility] office told him he did not have to go for a covid test on 08/13/20 and so did covid testing site.

## 2020-08-13 ENCOUNTER — Other Ambulatory Visit (HOSPITAL_COMMUNITY): Payer: Commercial Managed Care - PPO

## 2020-08-15 MED ORDER — BUPIVACAINE LIPOSOME 1.3 % IJ SUSP
20.0000 mL | Freq: Once | INTRAMUSCULAR | Status: DC
  Filled 2020-08-15: qty 20

## 2020-08-16 ENCOUNTER — Encounter (HOSPITAL_COMMUNITY)
Admission: RE | Disposition: A | Discharge status: Home / Self Care | Payer: Self-pay | Source: Ambulatory Visit | Attending: Surgery

## 2020-08-16 ENCOUNTER — Encounter (HOSPITAL_COMMUNITY): Payer: Self-pay | Admitting: Surgery

## 2020-08-16 ENCOUNTER — Ambulatory Visit (HOSPITAL_COMMUNITY): Payer: Commercial Managed Care - PPO | Admitting: Anesthesiology

## 2020-08-16 ENCOUNTER — Ambulatory Visit (HOSPITAL_COMMUNITY)
Admission: RE | Admit: 2020-08-16 | Discharge: 2020-08-16 | Disposition: A | Discharge status: Home / Self Care | Payer: Commercial Managed Care - PPO | Source: Ambulatory Visit | Attending: Surgery | Admitting: Surgery

## 2020-08-16 DIAGNOSIS — K648 Other hemorrhoids: Secondary | ICD-10-CM | POA: Insufficient documentation

## 2020-08-16 DIAGNOSIS — Z87891 Personal history of nicotine dependence: Secondary | ICD-10-CM | POA: Insufficient documentation

## 2020-08-16 DIAGNOSIS — Z79899 Other long term (current) drug therapy: Secondary | ICD-10-CM | POA: Insufficient documentation

## 2020-08-16 DIAGNOSIS — K611 Rectal abscess: Secondary | ICD-10-CM | POA: Diagnosis not present

## 2020-08-16 DIAGNOSIS — K644 Residual hemorrhoidal skin tags: Secondary | ICD-10-CM | POA: Insufficient documentation

## 2020-08-16 DIAGNOSIS — K604 Rectal fistula: Secondary | ICD-10-CM | POA: Insufficient documentation

## 2020-08-16 HISTORY — PX: EVALUATION UNDER ANESTHESIA WITH HEMORRHOIDECTOMY: SHX5624

## 2020-08-16 SURGERY — EXAM UNDER ANESTHESIA WITH HEMORRHOIDECTOMY
Anesthesia: General | Site: Anus

## 2020-08-16 MED ORDER — OXYCODONE HCL 5 MG PO TABS
5.0000 mg | ORAL_TABLET | Freq: Once | ORAL | Status: AC | PRN
  Administered 2020-08-16: 5 mg via ORAL

## 2020-08-16 MED ORDER — MIDAZOLAM HCL 5 MG/5ML IJ SOLN
INTRAMUSCULAR | Status: DC | PRN
  Administered 2020-08-16: 2 mg via INTRAVENOUS

## 2020-08-16 MED ORDER — PROPOFOL 10 MG/ML IV BOLUS
INTRAVENOUS | Status: DC | PRN
  Administered 2020-08-16: 180 mg via INTRAVENOUS

## 2020-08-16 MED ORDER — 0.9 % SODIUM CHLORIDE (POUR BTL) OPTIME
TOPICAL | Status: DC | PRN
  Administered 2020-08-16: 1000 mL

## 2020-08-16 MED ORDER — HYDROMORPHONE HCL 1 MG/ML IJ SOLN
0.2500 mg | INTRAMUSCULAR | Status: DC | PRN
  Administered 2020-08-16 (×4): 0.5 mg via INTRAVENOUS

## 2020-08-16 MED ORDER — HYDROMORPHONE HCL 1 MG/ML IJ SOLN
INTRAMUSCULAR | Status: AC
  Filled 2020-08-16: qty 1

## 2020-08-16 MED ORDER — CHLORHEXIDINE GLUCONATE 0.12 % MT SOLN
15.0000 mL | Freq: Once | OROMUCOSAL | Status: AC
  Administered 2020-08-16: 15 mL via OROMUCOSAL

## 2020-08-16 MED ORDER — OXYCODONE HCL 5 MG PO TABS
ORAL_TABLET | ORAL | Status: AC
  Filled 2020-08-16: qty 1

## 2020-08-16 MED ORDER — OXYCODONE HCL 5 MG/5ML PO SOLN: 5.0000 mg | Freq: Once | ORAL | Status: AC | PRN

## 2020-08-16 MED ORDER — DIBUCAINE (PERIANAL) 1 % EX OINT
TOPICAL_OINTMENT | CUTANEOUS | Status: DC | PRN
  Administered 2020-08-16: 1 via RECTAL

## 2020-08-16 MED ORDER — CHLORHEXIDINE GLUCONATE CLOTH 2 % EX PADS: 6.0000 | MEDICATED_PAD | Freq: Once | CUTANEOUS | Status: DC

## 2020-08-16 MED ORDER — LIDOCAINE 2% (20 MG/ML) 5 ML SYRINGE
INTRAMUSCULAR | Status: DC | PRN
  Administered 2020-08-16: 80 mg via INTRAVENOUS

## 2020-08-16 MED ORDER — PROPOFOL 10 MG/ML IV BOLUS
INTRAVENOUS | Status: AC
  Filled 2020-08-16: qty 20

## 2020-08-16 MED ORDER — SODIUM CHLORIDE 0.9 % IV SOLN
2.0000 g | INTRAVENOUS | Status: AC
  Administered 2020-08-16: 2 g via INTRAVENOUS
  Filled 2020-08-16: qty 20

## 2020-08-16 MED ORDER — DROPERIDOL 2.5 MG/ML IJ SOLN: 0.6250 mg | Freq: Once | INTRAMUSCULAR | Status: DC | PRN

## 2020-08-16 MED ORDER — METHYLENE BLUE 0.5 % INJ SOLN
INTRAVENOUS | Status: AC
  Filled 2020-08-16: qty 10

## 2020-08-16 MED ORDER — ONDANSETRON HCL 4 MG/2ML IJ SOLN
INTRAMUSCULAR | Status: DC | PRN
  Administered 2020-08-16: 4 mg via INTRAVENOUS

## 2020-08-16 MED ORDER — DIBUCAINE (PERIANAL) 1 % EX OINT
TOPICAL_OINTMENT | CUTANEOUS | Status: AC
  Filled 2020-08-16: qty 28

## 2020-08-16 MED ORDER — METHYLENE BLUE 1 % INJ SOLN
INTRAMUSCULAR | Status: DC | PRN
  Administered 2020-08-16: 2 mL

## 2020-08-16 MED ORDER — FENTANYL CITRATE (PF) 100 MCG/2ML IJ SOLN
INTRAMUSCULAR | Status: DC | PRN
  Administered 2020-08-16: 100 ug via INTRAVENOUS

## 2020-08-16 MED ORDER — BUPIVACAINE LIPOSOME 1.3 % IJ SUSP
INTRAMUSCULAR | Status: DC | PRN
  Administered 2020-08-16: 20 mL

## 2020-08-16 MED ORDER — SUGAMMADEX SODIUM 200 MG/2ML IV SOLN
INTRAVENOUS | Status: DC | PRN
  Administered 2020-08-16: 200 mg via INTRAVENOUS

## 2020-08-16 MED ORDER — CELECOXIB 200 MG PO CAPS
200.0000 mg | ORAL_CAPSULE | ORAL | Status: AC
  Administered 2020-08-16: 200 mg via ORAL
  Filled 2020-08-16: qty 1

## 2020-08-16 MED ORDER — ROCURONIUM BROMIDE 10 MG/ML (PF) SYRINGE
PREFILLED_SYRINGE | INTRAVENOUS | Status: DC | PRN
  Administered 2020-08-16: 80 mg via INTRAVENOUS

## 2020-08-16 MED ORDER — BUPIVACAINE-EPINEPHRINE (PF) 0.5% -1:200000 IJ SOLN
INTRAMUSCULAR | Status: AC
  Filled 2020-08-16: qty 30

## 2020-08-16 MED ORDER — BUPIVACAINE-EPINEPHRINE 0.5% -1:200000 IJ SOLN
INTRAMUSCULAR | Status: DC | PRN
  Administered 2020-08-16: 20 mL

## 2020-08-16 MED ORDER — FENTANYL CITRATE (PF) 100 MCG/2ML IJ SOLN
INTRAMUSCULAR | Status: AC
  Filled 2020-08-16: qty 2

## 2020-08-16 MED ORDER — GABAPENTIN 300 MG PO CAPS
300.0000 mg | ORAL_CAPSULE | ORAL | Status: AC
  Administered 2020-08-16: 300 mg via ORAL
  Filled 2020-08-16: qty 1

## 2020-08-16 MED ORDER — LIDOCAINE 2% (20 MG/ML) 5 ML SYRINGE
INTRAMUSCULAR | Status: DC | PRN
  Administered 2020-08-16: 1.5 mg/kg/h via INTRAVENOUS

## 2020-08-16 MED ORDER — DEXAMETHASONE SODIUM PHOSPHATE 10 MG/ML IJ SOLN
INTRAMUSCULAR | Status: DC | PRN
  Administered 2020-08-16: 10 mg via INTRAVENOUS

## 2020-08-16 MED ORDER — LACTATED RINGERS IV SOLN: INTRAVENOUS | Status: DC

## 2020-08-16 MED ORDER — ACETAMINOPHEN 500 MG PO TABS
1000.0000 mg | ORAL_TABLET | ORAL | Status: AC
  Administered 2020-08-16: 1000 mg via ORAL
  Filled 2020-08-16: qty 2

## 2020-08-16 MED ORDER — MEPERIDINE HCL 50 MG/ML IJ SOLN: 6.2500 mg | INTRAMUSCULAR | Status: DC | PRN

## 2020-08-16 MED ORDER — ENSURE PRE-SURGERY PO LIQD
296.0000 mL | Freq: Once | ORAL | Status: DC
  Filled 2020-08-16: qty 296

## 2020-08-16 MED ORDER — OXYCODONE HCL 5 MG PO TABS: 5.0000 mg | ORAL_TABLET | Freq: Four times a day (QID) | ORAL | 0 refills | Status: DC | PRN

## 2020-08-16 MED ORDER — METRONIDAZOLE 500 MG/100ML IV SOLN
500.0000 mg | INTRAVENOUS | Status: DC
  Filled 2020-08-16: qty 100

## 2020-08-16 MED ORDER — ORAL CARE MOUTH RINSE: 15.0000 mL | Freq: Once | OROMUCOSAL | Status: AC

## 2020-08-16 MED ORDER — MIDAZOLAM HCL 2 MG/2ML IJ SOLN
INTRAMUSCULAR | Status: AC
  Filled 2020-08-16: qty 2

## 2020-08-16 MED ORDER — KETAMINE HCL 10 MG/ML IJ SOLN
INTRAMUSCULAR | Status: DC | PRN
  Administered 2020-08-16: 40 mg via INTRAVENOUS

## 2020-08-16 SURGICAL SUPPLY — 36 items
BENZOIN TINCTURE PRP APPL 2/3 (GAUZE/BANDAGES/DRESSINGS) ×2 IMPLANT
BLADE SURG 15 STRL LF DISP TIS (BLADE) IMPLANT
BLADE SURG 15 STRL SS (BLADE)
BRIEF STRETCH FOR OB PAD LRG (UNDERPADS AND DIAPERS) ×2 IMPLANT
CNTNR URN SCR LID CUP LEK RST (MISCELLANEOUS) ×3 IMPLANT
CONT SPEC 4OZ STRL OR WHT (MISCELLANEOUS) ×3
COVER SURGICAL LIGHT HANDLE (MISCELLANEOUS) ×2 IMPLANT
COVER WAND RF STERILE (DRAPES) IMPLANT
DECANTER SPIKE VIAL GLASS SM (MISCELLANEOUS) ×2 IMPLANT
DRAPE LAPAROTOMY T 102X78X121 (DRAPES) ×2 IMPLANT
DRSG PAD ABDOMINAL 8X10 ST (GAUZE/BANDAGES/DRESSINGS) IMPLANT
ELECT REM PT RETURN 15FT ADLT (MISCELLANEOUS) ×2 IMPLANT
GAUZE 4X4 16PLY RFD (DISPOSABLE) ×2 IMPLANT
GAUZE SPONGE 4X4 12PLY STRL (GAUZE/BANDAGES/DRESSINGS) IMPLANT
GLOVE SURG LTX SZ8 (GLOVE) ×2 IMPLANT
GLOVE SURG UNDER LTX SZ8 (GLOVE) ×2 IMPLANT
GOWN STRL REUS W/TWL XL LVL3 (GOWN DISPOSABLE) ×4 IMPLANT
KIT BASIN OR (CUSTOM PROCEDURE TRAY) ×2 IMPLANT
KIT TURNOVER KIT A (KITS) ×2 IMPLANT
LOOP VESSEL MAXI BLUE (MISCELLANEOUS) IMPLANT
NEEDLE HYPO 22GX1.5 SAFETY (NEEDLE) ×2 IMPLANT
PACK BASIC VI WITH GOWN DISP (CUSTOM PROCEDURE TRAY) ×2 IMPLANT
PENCIL SMOKE EVACUATOR (MISCELLANEOUS) IMPLANT
SHEARS HARMONIC 9CM CVD (BLADE) IMPLANT
SURGILUBE 2OZ TUBE FLIPTOP (MISCELLANEOUS) ×2 IMPLANT
SUT CHROMIC 2 0 SH (SUTURE) ×2 IMPLANT
SUT CHROMIC 3 0 SH 27 (SUTURE) IMPLANT
SUT VIC AB 2-0 SH 27 (SUTURE)
SUT VIC AB 2-0 SH 27X BRD (SUTURE) IMPLANT
SUT VIC AB 2-0 UR6 27 (SUTURE) ×12 IMPLANT
SYR 20ML LL LF (SYRINGE) ×2 IMPLANT
SYR 3ML LL SCALE MARK (SYRINGE) IMPLANT
TOWEL OR 17X26 10 PK STRL BLUE (TOWEL DISPOSABLE) ×2 IMPLANT
TOWEL OR NON WOVEN STRL DISP B (DISPOSABLE) ×2 IMPLANT
TUBING CONNECTING 10 (TUBING) ×2 IMPLANT
YANKAUER SUCT BULB TIP NO VENT (SUCTIONS) ×2 IMPLANT

## 2020-08-16 NOTE — Anesthesia Preprocedure Evaluation (Addendum)
Anesthesia Evaluation  Patient identified by MRN, date of birth, ID band Patient awake    Reviewed: Allergy & Precautions, H&P , NPO status , Patient's Chart, lab work & pertinent test results  History of Anesthesia Complications Negative for: history of anesthetic complications  Airway Mallampati: II  TM Distance: >3 FB Neck ROM: Full    Dental no notable dental hx. (+) Dental Advisory Given, Teeth Intact   Pulmonary former smoker,    Pulmonary exam normal breath sounds clear to auscultation       Cardiovascular negative cardio ROS Normal cardiovascular exam Rhythm:Regular Rate:Normal     Neuro/Psych negative neurological ROS  negative psych ROS   GI/Hepatic negative GI ROS, Neg liver ROS,   Endo/Other  negative endocrine ROS  Renal/GU negative Renal ROS     Musculoskeletal   Abdominal   Peds  Hematology   Anesthesia Other Findings   Reproductive/Obstetrics                            Anesthesia Physical  Anesthesia Plan  ASA: I  Anesthesia Plan: General   Post-op Pain Management:    Induction: Intravenous  PONV Risk Score and Plan: 4 or greater and Ondansetron, Dexamethasone, Midazolam, Diphenhydramine and Treatment may vary due to age or medical condition  Airway Management Planned: Oral ETT  Additional Equipment: None  Intra-op Plan:   Post-operative Plan: Extubation in OR  Informed Consent: I have reviewed the patients History and Physical, chart, labs and discussed the procedure including the risks, benefits and alternatives for the proposed anesthesia with the patient or authorized representative who has indicated his/her understanding and acceptance.     Dental advisory given  Plan Discussed with: CRNA  Anesthesia Plan Comments:        Anesthesia Quick Evaluation

## 2020-08-16 NOTE — Op Note (Addendum)
08/16/2020  11:18 AM  PATIENT:  Glen Cain  35 y.o. male  Patient Care Team: Eulas Post, MD as PCP - Huston Foley, MD as Consulting Physician (General Surgery)  PRE-OPERATIVE DIAGNOSIS:   Anterior perirectal fistula Posterior perirectal abscess, possible fistula External anal hemmorhoid tag.    POST-OPERATIVE DIAGNOSIS:   Anterior superficial perirectal fistula  Posterior perirectal sinus tract External anal hemmorhoid tag.   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  INDICATION: Patient with probable perirectal fistula.  I recommended examination and surgical treatment.  Patient developed new abscess posteriorly that clinically he feels is better.  Also has tag/external hemorrhoid that is bothering him.  The anatomy & physiology of the anorectal region was discussed.  We discussed the pathophysiology of anorectal abscess and fistula.  Differential diagnosis was discussed.  Natural history progression was discussed.   I stressed the importance of a bowel regimen to have daily soft bowel movements to minimize progression of disease.     The  patient's condition is not adequately controlled.  Non-operative treatment has not healed the fistula.  Therefore, I recommended examination under anaesthesia to confirm the diagnosis and treat the fistula.  I discussed techniques that may be required such as fistulotomy, ligation by LIFT technique, and/or seton placement.  Benefits & alternatives discussed.  I noted a good likelihood this will help address the problem, but sometimes repeat operations and prolonged healing times may occur.  Risks such as bleeding, pain, recurrence, reoperation, incontinence, heart attack, death, and other risks were discussed.      Educational handouts further explaining the pathology, treatment options, and bowel regimen were given.  The patient expressed understanding & wishes to proceed.  We will work to coordinate surgery for a mutually convenient time.   OR FINDINGS:  Patient had a superificial anal fistula in the anterior midline  External location ANTERIOR MIDLINE   about 1 cm from anal verge.  Internal location : Anterior midline analverge about 0 cm from anal verge.  Patient also had a chronic draining sinus tract in the posterior midline from his recent abscess drained last week.  There is no evidence of any intersphincteric or active fistula.  Fistula tract/chronic abscess cavity excised and left open.  Left lateral hemorrhoidal tag with some irritation excised.  Grade 1-2 internal hemorrhoids.  No ligation/pexy done given numerous other procedures done  DESCRIPTION:   Informed consent was confirmed. Patient underwent general anesthesia without difficulty. Patient was placed into prone positioning.  The perianal region was prepped and draped in sterile fashion. Surgical timeout confirmed or plan.  I did digital rectal examination and then transitioned over to anoscopy to get a sense of the anatomy.  I did  place a probe through the external opening.  I also injected the track with methylene blue.  With  this I was able to locate an internal opening.  The tract was short and superficial.  About a centimeter long superficial anterior midline anal fistula.  No abscess located.  I therefore did a fistulotomy.  I excised through the skin and soft tissue of the superficial part of the superficial tract over a fistula probe.  I excised the heaped up tissue of anterior midline tags and midline raphae scarring and tunnel to have a nice broad open flat anterior midline perirectal wound.  Gentle probing confirmed deepest part resting on but not through the sphincters.  I assured hemostasis.  I used 2-0 Vicryl suture to help close some tissue over the sphincters transversely trimmed down to have a broad 4 x 4 centimeter wound.  Because of redundant folds and tags, I marsupialized the borders with running 2-0 chromic interrupted suture to avoid recannulization/fistula tunnel formation  I then found the posterior midline opening.  It did not fully close down yet.  I could probe about a centimeter and a half.  Injected with methylene blue.  There was no evidence of any internal opening.  Probed again and can confirm it did not go through the sphincters.  I excised the chronic abscess cavity and fistulous sinus tract and confirmed it came down to the sphincter complex but not through it.  I also did some sutures to help close some tissue and help cover and protect the sphincter complex and left a 2 x 2 centimeter open wound.  He had an irritated left lateral anal tag that he wished to have excised.  I trimmed that off.  It was rather pedunculated.    I reexamined the anal canal.   There is was no narrowing.  He had no evidence of proctitis.  There was no scarring or fistulous disease.  No water can appearance suspicious for Crohn's or inflammatory bowel disease.  Hemostasis was excellent.  I repeated anoscopy and examination.  Hemostasis was good.  We placed fluff gauze to onlay over the wounds.  No packing done.  Patient is  extubated & comfortable in PACU.  I updated the patient's status to the patient's spouse, Glen Cain.  Recommendations were made.  Questions were answered.  She expressed understanding & appreciation.   Adin Hector, M.D., F.A.C.S. Gastrointestinal and Minimally Invasive Surgery [facility] [address], [place], [place] [address] [phone] Main / Paging

## 2020-08-16 NOTE — Transfer of Care (Signed)
Immediate Anesthesia Transfer of Care Note  Patient: Glen Cain  Procedure(s) Performed: ANORECTAL EXAM UNDER ANESTHESIA, anterior superficial fistulotomy with marsupialization, excision of posterior anal sinus tract, excision of anal tag (N/A Anus)  Patient Location: PACU  Anesthesia Type:General  Level of Consciousness: awake, alert  and oriented  Airway & Oxygen Therapy: Patient Spontanous Breathing and Patient connected to face mask oxygen  Post-op Assessment: Report given to RN and Post -op Vital signs reviewed and stable  Post vital signs: Reviewed and stable  Last Vitals:  Vitals Value Taken Time  BP 135/82 08/16/20 1113  Temp    Pulse 73 08/16/20 1115  Resp 0 08/16/20 1115  SpO2 96 % 08/16/20 1115  Vitals shown include unvalidated device data.  Last Pain:  Vitals:   08/16/20 0753  TempSrc:   PainSc: 0-No pain         Complications: No complications documented.

## 2020-08-16 NOTE — Anesthesia Procedure Notes (Signed)
Procedure Name: Intubation Date/Time: 08/16/2020 9:47 AM Performed by: Jonan Seufert D, CRNA Pre-anesthesia Checklist: Patient identified, Emergency Drugs available, Suction available and Patient being monitored Patient Re-evaluated:Patient Re-evaluated prior to induction Oxygen Delivery Method: Circle system utilized Preoxygenation: Pre-oxygenation with 100% oxygen Induction Type: IV induction Ventilation: Mask ventilation without difficulty Laryngoscope Size: Mac and 4 Grade View: Grade I Tube type: Oral Tube size: 7.5 mm Number of attempts: 1 Airway Equipment and Method: Stylet Placement Confirmation: ETT inserted through vocal cords under direct vision,  positive ETCO2 and breath sounds checked- equal and bilateral Secured at: 22 cm Tube secured with: Tape Dental Injury: Teeth and Oropharynx as per pre-operative assessment

## 2020-08-16 NOTE — H&P (Signed)
Glen Cain DOB: 17-Oct-1985  Patient Care Team: Eulas Post, MD as PCP - Huston Foley, MD as Consulting Physician (General Surgery)  ` Patient sent for surgical consultation at the request of Dr Elease Hashimoto  Chief Complaint: Anal pain and drainage. Possible fistula versus atypical hemorrhoid ` ` The patient is a young male whose had some mild hemorrhoid issues in the past. However in late January had episode of worsening anal pain. Went to his primary care office. Suspect perhaps a thrombosed hemorrhoid but it did not improve. More suspicious for abscess. Incision and drainage in the office February 7. Placed on antibiotics. Seem to feel better but then had recurrent swelling and discomfort. A suspected. Surgical consultation offered. Appointment delayed a few weeks, so he went to [facility]. I suspected infected hemorrhoid and recommended Augmentin. Try some Proctofoam. He can get that filled. Patient notes he's been on Cipro and Augmentin antibiotics intermittently for over a month. He moves his bowels once or twice a day. Does not smoke. No diabetes. No sleep apnea. No blood thinners. No history of Crohn's also colitis or inflammatory bowel disease. No prior hemorrhoid issues in the past. He does recall having pain abscess lanced on his lower back and told it was a pilonidal abscess. Was followed impact and resolve nonoperatively 2012.  He decided to keep the appointment with [place] since he started having pain and swelling through the weekend. He notes he's having yellowish drainage. Somewhat uncomfortable to sit. No personal nor family history of GI/colon cancer, inflammatory bowel disease, irritable bowel syndrome, allergy such as Celiac Sprue, dietary/dairy problems, colitis, ulcers nor gastritis. No recent sick contacts/gastroenteritis. No travel outside the country. No changes in diet. No dysphagia to solids or liquids. No significant  heartburn or reflux. No melena, hematemesis, coffee ground emesis. No evidence of prior gastric/peptic ulceration.  (Review of systems as stated in this history (HPI) or in the review of systems. Otherwise all other 12 point ROS are negative) ` ` ###########################################`  This patient encounter took 30 minutes today to perform the following: obtain history, perform exam, review outside records, interpret tests & imaging, counsel the patient on their diagnosis; and, document this encounter, including findings & plan in the electronic health record (EHR).   Past Surgical History [name], CNA; 07/02/2020 10:05 AM) Gallbladder Surgery - Laparoscopic Knee Surgery Right. Oral Surgery  Diagnostic Studies History [name], CNA; 07/02/2020 10:05 AM) Colonoscopy never  Allergies [name], CNA; 07/02/2020 10:05 AM) No Known Drug Allergies [01/31/2014]: Allergies Reconciled  Medication History [name], CNA; 07/02/2020 10:05 AM) Amoxicillin-Pot Clavulanate (875-125MG Tablet, Oral) Active. Ciprofloxacin HCl (500MG Tablet, Oral) Active. Hydrocortisone (Perianal) (2.5% Cream, External) Active. metroNIDAZOLE (500MG Tablet, Oral) Active. Pramoxine HCl (Perianal) (1% Foam, External) Active. Multivitamins (Oral) Active. Medications Reconciled  Social History [name], CNA; 07/02/2020 10:05 AM) Alcohol use Moderate alcohol use, Occasional alcohol use. Caffeine use Carbonated beverages, Tea. No drug use Tobacco use Former smoker.  Family History [name], CNA; 07/02/2020 10:05 AM) Hypertension Brother, Father, Mother.  Other Problems [name], CNA; 07/02/2020 10:05 AM) Hemorrhoids     Review of Systems [name] CNA; 07/02/2020 10:05 AM) General Present- Chills and Night Sweats. Not Present- Appetite Loss, Fatigue, Fever, Weight Gain and Weight Loss. Skin Present- New Lesions. Not  Present- Change in Wart/Mole, Dryness, Hives, Jaundice, Non-Healing Wounds, Rash and Ulcer. HEENT Present- Wears glasses/contact lenses. Not Present- Earache, Hearing Loss, Hoarseness, Nose Bleed, Oral Ulcers, Ringing in the Ears, Seasonal Allergies, Sinus  Pain, Sore Throat, Visual Disturbances and Yellow Eyes. Respiratory Not Present- Bloody sputum, Chronic Cough, Difficulty Breathing, Snoring and Wheezing. Breast Not Present- Breast Mass, Breast Pain, Nipple Discharge and Skin Changes. Cardiovascular Not Present- Chest Pain, Difficulty Breathing Lying Down, Leg Cramps, Palpitations, Rapid Heart Rate, Shortness of Breath and Swelling of Extremities. Gastrointestinal Present- Hemorrhoids and Rectal Pain. Not Present- Abdominal Pain, Bloating, Bloody Stool, Change in Bowel Habits, Chronic diarrhea, Constipation, Difficulty Swallowing, Excessive gas, Gets full quickly at meals, Indigestion, Nausea and Vomiting. Male Genitourinary Present- Frequency. Not Present- Blood in Urine, Change in Urinary Stream, Impotence, Nocturia, Painful Urination, Urgency and Urine Leakage. Musculoskeletal Not Present- Back Pain, Joint Pain, Joint Stiffness, Muscle Pain, Muscle Weakness and Swelling of Extremities. Neurological Present- Headaches. Not Present- Decreased Memory, Fainting, Numbness, Seizures, Tingling, Tremor, Trouble walking and Weakness. Psychiatric Present- Change in Sleep Pattern. Not Present- Anxiety, Bipolar, Depression, Fearful and Frequent crying. Endocrine Not Present- Cold Intolerance, Excessive Hunger, Hair Changes, Heat Intolerance, Hot flashes and New Diabetes. Hematology Not Present- Blood Thinners, Easy Bruising, Excessive bleeding, Gland problems, HIV and Persistent Infections.  Vitals (Donyelle Alston CNA; 07/02/2020 10:06 AM) 07/02/2020 10:05 AM Weight: 205.5 lb Height: 70in Body Surface Area: 2.11 m Body Mass Index: 29.49 kg/m  Temp.: 97.66F  Pulse: 94 (Regular)  P.OX: 96% (Room  air) BP: 120/70(Sitting, Left Arm, Standard)        Physical Exam Adin Hector MD; 07/02/2020 1:33 PM)  General Mental Status-Alert. General Appearance-Not in acute distress, Not Sickly. Orientation-Oriented X3. Hydration-Well hydrated. Voice-Normal.  Integumentary Global Assessment Upon inspection and palpation of skin surfaces of the - Axillae: non-tender, no inflammation or ulceration, no drainage. and Distribution of scalp and body hair is normal. General Characteristics Temperature - normal warmth is noted.  Head and Neck Head-normocephalic, atraumatic with no lesions or palpable masses. Face Global Assessment - atraumatic, no absence of expression. Neck Global Assessment - no abnormal movements, no bruit auscultated on the right, no bruit auscultated on the left, no decreased range of motion, non-tender. Trachea-midline. Thyroid Gland Characteristics - non-tender.  Eye Eyeball - Left-Extraocular movements intact, No Nystagmus - Left. Eyeball - Right-Extraocular movements intact, No Nystagmus - Right. Cornea - Left-No Hazy - Left. Cornea - Right-No Hazy - Right. Sclera/Conjunctiva - Left-No scleral icterus, No Discharge - Left. Sclera/Conjunctiva - Right-No scleral icterus, No Discharge - Right. Pupil - Left-Direct reaction to light normal. Pupil - Right-Direct reaction to light normal.  ENMT Ears Pinna - Left - no drainage observed, no generalized tenderness observed. Pinna - Right - no drainage observed, no generalized tenderness observed. Nose and Sinuses External Inspection of the Nose - no destructive lesion observed. Inspection of the nares - Left - quiet respiration. Inspection of the nares - Right - quiet respiration. Mouth and Throat Lips - Upper Lip - no fissures observed, no pallor noted. Lower Lip - no fissures observed, no pallor noted. Nasopharynx - no discharge present. Oral Cavity/Oropharynx - Tongue -  no dryness observed. Oral Mucosa - no cyanosis observed. Hypopharynx - no evidence of airway distress observed.  Chest and Lung Exam Inspection Movements - Normal and Symmetrical. Accessory muscles - No use of accessory muscles in breathing. Palpation Palpation of the chest reveals - Non-tender. Auscultation Breath sounds - Normal and Clear.  Cardiovascular Auscultation Rhythm - Regular. Murmurs & Other Heart Sounds - Auscultation of the heart reveals - No Murmurs and No Systolic Clicks.  Abdomen Inspection Inspection of the abdomen reveals - No Visible peristalsis and No Abnormal  pulsations. Umbilicus - No Bleeding, No Urine drainage. Palpation/Percussion Palpation and Percussion of the abdomen reveal - Soft, Non Tender, No Rebound tenderness, No Rigidity (guarding) and No Cutaneous hyperesthesia. Note: Abdomen soft. Nontender. Not distended. Small umbilical hernia at the base. No other incisional hernias. No guarding.  Male Genitourinary Sexual Maturity Tanner 5 - Adult hair pattern and Adult penile size and shape. Note: No inguinal hernias. Normal external genitalia. Epididymi, testes, and spermatic cords normal without any masses.  Rectal Note: Right anterior midline external hemorrhoid with draining sinus. Spontaneous flatus and drainage. Rather tender and sensitive. Highly suspicious for fistula going to external hemorrhoid. Some fullness of the midline raphae. Overall tender but I feel no deeper fluctuance or abscess.  Mildly increased sphincter tone. No posterior midline fissure. No evidence of any other abscesses or sinuses elsewhere. I held off on internal exam. He does have one or 2 small pits consistent with mild pilonidal disease but no active pilonidal abscess. No condyloma.  Peripheral Vascular Upper Extremity Inspection - Left - No Cyanotic nailbeds - Left, Not Ischemic. Inspection - Right - No Cyanotic nailbeds - Right, Not  Ischemic.  Neurologic Neurologic evaluation reveals -normal attention span and ability to concentrate, able to name objects and repeat phrases. Appropriate fund of knowledge , normal sensation and normal coordination. Mental Status Affect - not angry, not paranoid. Cranial Nerves-Normal Bilaterally. Gait-Normal.  Neuropsychiatric Mental status exam performed with findings of-able to articulate well with normal speech/language, rate, volume and coherence, thought content normal with ability to perform basic computations and apply abstract reasoning and no evidence of hallucinations, delusions, obsessions or homicidal/suicidal ideation.  Musculoskeletal Global Assessment Spine, Ribs and Pelvis - no instability, subluxation or laxity. Right Upper Extremity - no instability, subluxation or laxity.  Lymphatic Head & Neck  General Head & Neck Lymphatics: Bilateral - Description - No Localized lymphadenopathy. Axillary  General Axillary Region: Bilateral - Description - No Localized lymphadenopathy. Femoral & Inguinal  Generalized Femoral & Inguinal Lymphatics: Left - Description - No Localized lymphadenopathy. Right - Description - No Localized lymphadenopathy.    Assessment & Plan Adin Hector MD; 07/02/2020 10:44 AM)  ANAL FISTULA (K60.3) Impression: Irritated external hemorrhoid perhaps thrombosed versus abscess status post incision and drainage with persistent drainage for the past month suspicious for fistula.  I think this is a fistula since it has not been able to heal appropriately for the past 6 weeks. I think he requires operative exploration with unroofing versus lift repair. He may require to surgeries with unroofing and seton and then a more formal repair in the future. We will see.  Try and hold off on reviewing antibiotics since things have calmed down. If he gets recurrent symptoms, he may need to stay on Augmentin.  Because this is been going  on for a while, we'll try and get this done more urgently since I don't think this can wait a few more months.    Pt Education - [facility] Abscess/Fistula (AT): discussed with patient and provided information. The anatomy & physiology of the anorectal region was discussed. We discussed the pathophysiology of anorectal abscess and fistula. Differential diagnosis was discussed. Natural history progression was discussed. I stressed the importance of a bowel regimen to have daily soft bowel movements to minimize progression of disease.  The patient's condition is not adequately controlled. Non-operative treatment has not healed the fistula. Therefore, I recommended examination under anaesthesia to confirm the diagnosis and treat the fistula. I discussed techniques that may be  required such as fistulotomy, ligation by LIFT technique, and/or seton placement. Benefits & alternatives discussed. I noted a good likelihood this will help address the problem, but sometimes repeat operations and prolonged healing times may occur. Risks such as bleeding, pain, recurrence, reoperation, incontinence, heart attack, death, and other risks were discussed.  Educational handouts further explaining the pathology, treatment options, and bowel regimen were given. The patient expressed understanding & wishes to proceed. We will work to coordinate surgery for a mutually convenient time.    Adin Hector, MD, FACS, MASCRS  Esophageal, Gastrointestinal & Colorectal Surgery Robotic and Minimally Invasive Surgery [facility] [address] [place], [place] [address] [phone] Fax [phone] Main/Paging  CONTACT INFORMATION: Weekday (9AM-5PM) concerns: Call [facility] main office at [phone] Weeknight (5PM-9AM) or Weekend/Holiday concerns: Check [email or web address] for General Surgery [facility] coverage (Please, do not use SecureChat as it is not reliable communication to  operating surgeons for immediate patient care)          ADDENDUM - RECURRENT ABSCESS S/P I&d LAST WEEK:  Glen Cain Appointment: 08/09/2020 9:20 AM Location: [facility] Patient #: [id number] DOB: [birth year] Married / Language: Cleophus Molt / Race: White Male   History of Present Illness Felicie Morn MD; 08/09/2020 9:56 AM) Patient words: Mr. Lepore presents for recurrent perianal pain. Pain is in the posterior midline, where previously he had drainage from his anterior midline. He can't poop or pee without pain. He's been dealing with perianal/perirectal abscess issues since January and is scheduled for surgery with Dr. Johney Maine next week.  The patient is a 36 year old male.   Allergies [facility], CMA; 08/09/2020 9:11 AM) No Known Drug Allergies  [01/31/2014]: Allergies Reconciled   Medication History Altamese Cabal, CMA; 08/09/2020 9:11 AM) Valium (5MG Tablet, 1 (one) Oral twice daily, as needed for anal spasms, Taken starting 08/06/2020) Active. Amoxicillin-Pot Clavulanate (875-125MG Tablet, Oral) Active. Ciprofloxacin HCl (500MG Tablet, Oral) Active. Hydrocortisone (Perianal) (2.5% Cream, External) Active. metroNIDAZOLE (500MG Tablet, Oral) Active. Pramoxine HCl (Perianal) (1% Foam, External) Active. Multivitamins (Oral) Active. Medications Reconciled    Review of Systems Felicie Morn MD; 08/09/2020 9:56 AM) General Not Present- Appetite Loss, Chills, Fatigue, Fever, Night Sweats, Weight Gain and Weight Loss. Note: All other systems negative (unless as noted in HPI & included Review of Systems) Skin Not Present- Change in Wart/Mole, Dryness, Hives, Jaundice, New Lesions, Non-Healing Wounds, Rash and Ulcer. HEENT Not Present- Earache, Hearing Loss, Hoarseness, Nose Bleed, Oral Ulcers, Ringing in the Ears, Seasonal Allergies, Sinus Pain, Sore Throat, Visual Disturbances, Wears glasses/contact lenses and Yellow Eyes. Respiratory Not  Present- Bloody sputum, Chronic Cough, Difficulty Breathing, Snoring and Wheezing. Breast Not Present- Breast Mass, Breast Pain, Nipple Discharge and Skin Changes. Cardiovascular Not Present- Chest Pain, Difficulty Breathing Lying Down, Leg Cramps, Palpitations, Rapid Heart Rate, Shortness of Breath and Swelling of Extremities. Gastrointestinal Not Present- Abdominal Pain, Bloating, Bloody Stool, Change in Bowel Habits, Chronic diarrhea, Constipation, Difficulty Swallowing, Excessive gas, Gets full quickly at meals, Hemorrhoids, Indigestion, Nausea, Rectal Pain and Vomiting. Male Genitourinary Not Present- Blood in Urine, Change in Urinary Stream, Frequency, Impotence, Nocturia, Painful Urination, Urgency and Urine Leakage. Musculoskeletal Not Present- Back Pain, Joint Pain, Joint Stiffness, Muscle Pain, Muscle Weakness and Swelling of Extremities. Neurological Not Present- Decreased Memory, Fainting, Headaches, Numbness, Seizures, Tingling, Tremor, Trouble walking and Weakness. Psychiatric Not Present- Anxiety, Bipolar, Change in Sleep Pattern, Depression, Fearful and Frequent crying. Endocrine Not Present- Cold Intolerance, Excessive Hunger, Hair Changes,  Heat Intolerance and New Diabetes. Hematology Not Present- Easy Bruising, Excessive bleeding, Gland problems, HIV and Persistent Infections.   Physical Exam Felicie Morn MD; 08/09/2020 10:10 AM) General Mental Status-Alert. General Appearance-Consistent with stated age. Posture-Normal posture. Voice-Normal.  Integumentary Note: no rash or lesion on limited exam   Head and Neck Head -Note: atraumatic, normocephalic.  Face Strength and Tone - facial muscle strength and tone is normal.  Eye Eyeball - Bilateral-Extraocular movements intact. Sclera/Conjunctiva - Bilateral-Normal.  Chest and Lung Exam Chest and lung exam reveals -quiet, even and easy respiratory effort with no use of accessory  muscles.  Rectal Note: no abnormality on visual inspection, posterior midline fluctuance on digital exam, fullness feels posterior midline, tracking more towards the patient's right on rectal exam.   Neuropsychiatric Mental status exam performed with findings of-able to articulate well with normal speech/language, rate, volume and coherence. The patient's mood and affect are described as -normal. Judgment and Insight-insight is appropriate concerning matters relevant to self and the patient displays appropriate judgment regarding every day activities. Thought Processes/Cognitive Function-aware of current events.  Musculoskeletal Note: strength symmetrical throughout, no deformity    Results Felicie Morn MD; 08/09/2020 10:16 AM) Procedures  Name Value Date INCISION AND DRAINAGE, PERIANAL ABSCESS [id number]) [ I&D Abscess/Cyst ] Informed Consent: Yes Indication: Pain Anatomical Location: Perirectal abscess in posterior midline, tracking to the right..........Marland KitchenMarland KitchenAt the bedside in the office, the patients perirectal area was anesthetized with lidocaine. A 11 blade was inserted into the abscess and ~300 cc of purulent drainage was encountered. Packing was inserted into the abscess cavity. Okay to remove packing in 24 hours or if it falls out on its own that's okay too. Follow up with Dr. Johney Maine for EUA next week. Anesthetic: Lidocaine 1% Procedure performed: I & D Packing: Yes Bacterial culture sent: No Site Appearance Post Procedure: Minimal bleeding observed Patient response to procedure: No immediate complications observed Other Recommendations/Patient Instructions: Call office if excessive swelling, redness, fever or pain occur Call office if symptoms worsen, or no improvement  Performed: 08/09/2020 9:20 AM    Assessment & Plan Felicie Morn MD; 08/09/2020 10:12 AM) PERIANAL ABSCESS (K61.0) Story: Incised and drained at  bedside in the office. Continue with plan for EUA with Dr. Johney Maine next week. Current Plans INCISION AND DRAINAGE, PERIANAL ABSCESS [id number]) Note:At the bedside in the office, the patients perirectal area was anesthetized with lidocaine. A 11 blade was inserted into the abscess and ~300 cc of purulent drainage was encountered. Packing was inserted into the abscess cavity. Okay to remove packing in 24 hours or if it falls out on its own that's okay too. Follow up with Dr. Johney Maine for EUA next week.  Signed by Felicie Morn, MD (08/09/2020 10:17 AM)

## 2020-08-16 NOTE — Interval H&P Note (Signed)
History and Physical Interval Note:  08/16/2020 9:04 AM  Glen Cain  has presented today for surgery, with the diagnosis of PERIRECTAL FISTULA.  The various methods of treatment have been discussed with the patient and family. After consideration of risks, benefits and other options for treatment, the patient has consented to  Procedure(s): ANORECTAL EXAM UNDER ANESTHESIA WITH POSSIBLE HEMORRHOIDECTOMY AND REPAIR OF PERIRECTAL FISTULA (N/A) as a surgical intervention.  The patient's history has been reviewed, patient examined, no change in status, stable for surgery.  I have reviewed the patient's chart and labs.  Questions were answered to the patient's satisfaction.    I have re-reviewed the the patient's records, history, medications, and allergies.  I have re-examined the patient.  I again discussed intraoperative plans and goals of post-operative recovery.  The patient agrees to proceed.  Glen Cain  09-20-1985 [id number]  Patient Care Team: Eulas Post, MD as PCP - Huston Foley, MD as Consulting Physician (General Surgery)  Patient Active Problem List   Diagnosis Date Noted   NEOPLASM OF UNCERTAIN BEHAVIOR OF SKIN 10/18/2009   UNSPECIFIED DISORDER OF SKIN&SUBCUTANEOUS TISSUE 10/18/2009   Benign neoplasm of skin, site unspecified 08/22/2009    Past Medical History:  Diagnosis Date   Benign neoplasm of skin, site unspecified 08/22/2009    Past Surgical History:  Procedure Laterality Date   ARTHROSCOPIC REPAIR ACL Right 07   CHOLECYSTECTOMY  12/15   KNEE ARTHROSCOPY Right 06/16/2013   Procedure: RIGHT ARTHROSCOPY KNEE MENISECTOMY WITH DEBRIDEMENT ;  Surgeon: Marin Shutter, MD;  Location: [facility];  Service: Orthopedics;  Laterality: Right;   WISDOM TOOTH EXTRACTION      Social History   Socioeconomic History   Marital status: Married    Spouse name: Not on file   Number of children: Not on file   Years of education: Not on file   Highest education  level: Not on file  Occupational History   Not on file  Tobacco Use   Smoking status: Former Smoker    Types: Cigarettes    Quit date: 06/14/2009    Years since quitting: 11.1   Smokeless tobacco: Former Systems developer   Tobacco comment: Risk analyst Use: Never used  Substance and Sexual Activity   Alcohol use: Yes   Drug use: No   Sexual activity: Not on file  Other Topics Concern   Not on file  Social History Narrative   Not on file   Social Determinants of Health   Financial Resource Strain: Not on file  Food Insecurity: Not on file  Transportation Needs: Not on file  Physical Activity: Not on file  Stress: Not on file  Social Connections: Not on file  Intimate Partner Violence: Not on file    Family History  Problem Relation Age of Onset   Diabetes Other        fhx   Hypertension Other        fhx   Cancer Other        fhx   Stroke Other        fhx    Medications Prior to Admission  Medication Sig Dispense Refill Last Dose   acetaminophen (TYLENOL) 500 MG tablet Take 1,000 mg by mouth every 6 (six) hours as needed for moderate pain.   Past Week at Unknown time   diazepam (VALIUM) 5 MG tablet Take 5 mg by mouth in the morning and at bedtime.   Past Week  at Unknown time   ibuprofen (ADVIL) 200 MG tablet Take 600-800 mg by mouth every 6 (six) hours as needed for moderate pain.   Past Week at Unknown time   Multiple Vitamin (MULTIVITAMIN) tablet Take 1 tablet by mouth daily.   Past Week at Unknown time   vancomycin (VANCOCIN HCL) 125 MG capsule Take 1 capsule (125 mg total) by mouth 4 (four) times daily. (Patient not taking: Reported on 08/06/2020) 40 capsule 0 Not Taking at Unknown time    Current Facility-Administered Medications  Medication Dose Route Frequency Provider Last Rate Last Admin   bupivacaine liposome (EXPAREL) 1.3 % injection 266 mg  20 mL Infiltration Once Michael Boston, MD       cefTRIAXone (ROCEPHIN) 2 g in sodium chloride 0.9 % 100 mL IVPB  2  g Intravenous On Call to OR Michael Boston, MD       And   metroNIDAZOLE (FLAGYL) IVPB 500 mg  500 mg Intravenous On Call to OR Michael Boston, MD       Chlorhexidine Gluconate Cloth 2 % PADS 6 each  6 each Topical Once Michael Boston, MD       And   Chlorhexidine Gluconate Cloth 2 % PADS 6 each  6 each Topical Once Michael Boston, MD       Derrill Memo ON 08/17/2020] feeding supplement (ENSURE PRE-SURGERY) liquid 296 mL  296 mL Oral Once Michael Boston, MD       lactated ringers infusion   Intravenous Continuous Barnet Glasgow, MD 10 mL/hr at 08/16/20 1610 Continued from Pre-op at 08/16/20 0808     No Known Allergies  BP (!) 145/96 (BP Location: Right Arm)   Pulse 96   Temp 98.3 F (36.8 C) (Oral)   Resp 18   Wt 86.2 kg   SpO2 98%   BMI 27.26 kg/m   Labs: No results found for this or any previous visit (from the past 48 hour(s)).  Imaging / Studies: No results found.   Adin Hector, M.D., F.A.C.S. Gastrointestinal and Minimally Invasive Surgery [facility] [address] [place], [place] [address] [phone] Main / Paging  08/16/2020 9:04 AM    Adin Hector

## 2020-08-16 NOTE — Discharge Instructions (Signed)
ANORECTAL SURGERY:  POST OPERATIVE INSTRUCTIONS  ######################################################################  EAT Start with a pureed / full liquid diet After 24 hours, gradually transition to a high fiber diet.    CONTROL PAIN Control pain so you can tolerate bowel movements,  walk, sleep, tolerate sneezing/coughing, and go up/down stairs.   HAVE A BOWEL MOVEMENT DAILY Keep your bowels regular to avoid problems.   Taking a fiber supplement every day to keep bowels soft.   Try a laxative to override constipation. Use an antidairrheal to slow down diarrhea.   Call if not better after 2 tries  WALK Walk an hour a day.  Control your pain to do that.   CALL IF YOU HAVE PROBLEMS/CONCERNS Call if you are still struggling despite following these instructions. Call if you have concerns not answered by these instructions  ######################################################################    1. Take your usually prescribed home medications unless otherwise directed.  2. DIET: Follow a light bland diet & liquids the first 24 hours after arrival home, such as soup, liquids, starches, etc.  Be sure to drink plenty of fluids.  Quickly advance to a usual solid diet within a few days.  Avoid fast food or heavy meals as your are more likely to get nauseated or have irregular bowels.  A low-fat, high-fiber diet for the rest of your life is ideal.  3. PAIN CONTROL: a. Pain is best controlled by a usual combination of three different methods TOGETHER: i. Ice/Heat ii. Over the counter pain medication iii. Prescription pain medication b. Expect swelling and discomfort in the anus/rectal area.  Warm water baths (30-60 minutes up to 6 times a day, especially after bowel meovements) will help. Use ice for the first few days to help decrease swelling and bruising, then switch to heat such as warm towels, sitz baths, warm baths, etc to help relax tight/sore spots and speed recovery.   Some people prefer to use ice alone, heat alone, alternating between ice & heat.  Experiment to what works for you.   c. It is helpful to take an over-the-counter pain medication continuously for the first few weeks.  Choose one of the following that works best for you: i. Naproxen (Aleve, etc)  Two 250m tabs twice a day ii. Ibuprofen (Advil, etc) Three 2072mtabs four times a day (every meal & bedtime) iii. Acetaminophen (Tylenol, etc) 500-65044mour times a day (every meal & bedtime) d. A  prescription for pain medication (such as oxycodone, hydrocodone, etc) should be given to you upon discharge.  Take your pain medication as prescribed.  i. If you are having problems/concerns with the prescription medicine (does not control pain, nausea, vomiting, rash, itching, etc), please call us [phone] see if we need to switch you to a different pain medicine that will work better for you and/or control your side effect better. ii. If you need a refill on your pain medication, please contact your pharmacy.  They will contact our office to request authorization. Prescriptions will not be filled after 5 pm or on week-ends.  If can take up to 48 hours for it to be filled & ready so avoid waiting until you are down to thel ast pill. e. A topical cream (Dibucaine) or a prescription for a cream (such as diltiazem 2% gel) may be given to you.  Many people find relief with topical creams.  Some people find it burns too much.  Experiment.  If it helps, use it.  If it burns, don't using  it.  Use a Sitz Bath 4-8 times a day for relief   [employer] A sitz bath is a warm water bath taken in the sitting position that covers only the hips and buttocks. It may be used for either healing or hygiene purposes. Sitz baths are also used to relieve pain, itching, or muscle spasms. The water may contain medicine. Moist heat will help you heal and relax.  HOME CARE INSTRUCTIONS  Take 3 to 4 sitz baths a day. 1. Fill the  bathtub half full with warm water. 2. Sit in the water and open the drain a little. 3. Turn on the warm water to keep the tub half full. Keep the water running constantly. 4. Soak in the water for 15 to 20 minutes. 5. After the sitz bath, pat the affected area dry first.   4. KEEP YOUR BOWELS REGULAR a. The goal is one soft bowel movement a day b. Avoid getting constipated.  Between the surgery and the pain medications, it is common to experience some constipation.  Increasing fluid intake and taking a fiber supplement (such as Metamucil, Citrucel, FiberCon, MiraLax, etc) 2-3 times a day regularly will usually help prevent this problem from occurring.  A mild laxative (prune juice, Milk of Magnesia, MiraLax, etc) should be taken according to package directions if there are no bowel movements after 48 hours. c. Watch out for diarrhea.  If you have many loose bowel movements, simplify your diet to bland foods & liquids for a few days.  Stop any stool softeners and decrease your fiber supplement.  Switching to mild anti-diarrheal medications (Kayopectate, Pepto Bismol) can help.  Can try an imodium/loperamide dose.  If this worsens or does not improve, please call us.  5. Wound Care  a. Remove your bandages with your first bowel movement, usually the day after surgery.  Let the gauze fall off with the first bowel movement or shower.   b. Wear an absorbent pad or soft cotton balls in your underwear as needed to catch any drainage and help keep the area  c. Keep the area clean and dry.  Bathe / shower every day.  Keep the area clean by showering / bathing over the incision / wound.   It is okay to soak an open wound to help wash it.  Consider using a squeeze bottle filled with warm water to gently wash the anal area.  Wet wipes or showers / gentle washing after bowel movements is often less traumatic than regular toilet paper. d. Dennis Bast will often notice bleeding with bowel movements.  This should slow down  by the end of the first week of surgery.  Sitting on an ice pack can help. e. Expect some drainage.  This should slow down by the end of the first week of surgery, but you will have occasional bleeding or drainage up to a few months after surgery.  Wear an absorbent pad or soft cotton gauze in your underwear until the drainage stops.  6. ACTIVITIES as tolerated:   a. You may resume regular (light) daily activities beginning the next day--such as daily self-care, walking, climbing stairs--gradually increasing activities as tolerated.  If you can walk 30 minutes without difficulty, it is safe to try more intense activity such as jogging, treadmill, bicycling, low-impact aerobics, swimming, etc. b. Save the most intensive and strenuous activity for last such as sit-ups, heavy lifting, contact sports, etc  Refrain from any heavy lifting or straining until you are off narcotics for  pain control.   c. DO NOT PUSH THROUGH PAIN.  Let pain be your guide: If it hurts to do something, don't do it.  Pain is your body warning you to avoid that activity for another week until the pain goes down. d. You may drive when you are no longer taking prescription pain medication, you can comfortably sit for long periods of time, and you can safely maneuver your car and apply brakes. e. Dennis Bast may have sexual intercourse when it is comfortable.  7. FOLLOW UP in our office a. Please call [facility] at [phone] to set up an appointment to see your surgeon in the office for a follow-up appointment approximately 2-3 weeks after your surgery. b. Make sure that you call for this appointment the day you arrive home to ensure a convenient appointment time.  8. IF YOU HAVE DISABILITY OR FAMILY LEAVE FORMS, BRING THEM TO THE OFFICE FOR PROCESSING.  DO NOT GIVE THEM TO YOUR DOCTOR.        WHEN TO CALL us [phone]: 1. Poor pain control 2. Reactions / problems with new medications (rash/itching, nausea, etc)  3. Fever over  101.5 F (38.5 C) 4. Inability to urinate 5. Nausea and/or vomiting 6. Worsening swelling or bruising 7. Continued bleeding from incision. 8. Increased pain, redness, or drainage from the incision  The clinic staff is available to answer your questions during regular business hours (8:30am-5pm).  Please don't hesitate to call and ask to speak to one of our nurses for clinical concerns.   A surgeon from [facility] is always on call at the hospitals   If you have a medical emergency, go to the nearest emergency room or call 911.    [facility], [place], [place], [place], [place]  [id number] ? MAIN: [phone] ? TOLL FREE: [phone] ? FAX [phone] [email or web address]

## 2020-08-17 ENCOUNTER — Encounter: Payer: Self-pay | Admitting: Physician Assistant

## 2020-08-17 ENCOUNTER — Encounter (HOSPITAL_COMMUNITY): Payer: Self-pay | Admitting: Surgery

## 2020-08-20 LAB — SURGICAL PATHOLOGY

## 2020-08-28 NOTE — Anesthesia Postprocedure Evaluation (Signed)
Anesthesia Post Note  Patient: Glen Cain  Procedure(s) Performed: ANORECTAL EXAM UNDER ANESTHESIA, anterior superficial fistulotomy with marsupialization, excision of posterior anal sinus tract, excision of anal tag (N/A Anus)     Patient location during evaluation: PACU Anesthesia Type: General Level of consciousness: sedated and patient cooperative Pain management: pain level controlled Vital Signs Assessment: post-procedure vital signs reviewed and stable Respiratory status: spontaneous breathing Cardiovascular status: stable Anesthetic complications: no   No complications documented.  Last Vitals:  Vitals:   08/16/20 1145 08/16/20 1200  BP: 122/76 132/79  Pulse: 66 80  Resp: 12 19  Temp:  36.4 C  SpO2: 92% 92%    Last Pain:  Vitals:   08/16/20 1200  TempSrc:   PainSc: [place]

## 2020-09-12 ENCOUNTER — Ambulatory Visit (INDEPENDENT_AMBULATORY_CARE_PROVIDER_SITE_OTHER): Payer: Commercial Managed Care - PPO | Admitting: Physician Assistant

## 2020-09-12 ENCOUNTER — Encounter: Payer: Self-pay | Admitting: Physician Assistant

## 2020-09-12 VITALS — BP 110/70 | HR 84 | Ht 70.0 in | Wt 200.0 lb

## 2020-09-12 DIAGNOSIS — R1084 Generalized abdominal pain: Secondary | ICD-10-CM | POA: Diagnosis not present

## 2020-09-12 DIAGNOSIS — L988 Other specified disorders of the skin and subcutaneous tissue: Secondary | ICD-10-CM

## 2020-09-12 DIAGNOSIS — K611 Rectal abscess: Secondary | ICD-10-CM | POA: Diagnosis not present

## 2020-09-12 DIAGNOSIS — R197 Diarrhea, unspecified: Secondary | ICD-10-CM

## 2020-09-12 MED ORDER — PLENVU 140 G PO SOLR: ORAL | 0 refills | Status: DC

## 2020-09-12 NOTE — Patient Instructions (Signed)
If you are age 37 or older, your body mass index should be between 23-30. Your Body mass index is 28.7 kg/m. If this is out of the aforementioned range listed, please consider follow up with your Primary Care Provider.  If you are age 63 or younger, your body mass index should be between 19-25. Your Body mass index is 28.7 kg/m. If this is out of the aformentioned range listed, please consider follow up with your Primary Care Provider.   You have been scheduled for a colonoscopy. Please follow written instructions given to you at your visit today.  Please pick up your prep supplies at the pharmacy within the next 1-3 days. If you use inhalers (even only as needed), please bring them with you on the day of your procedure.   The [facility] providers would like to encourage you to use [facility] to communicate with providers for non-urgent requests or questions.  Due to long hold times on the telephone, sending your provider a message by [facility] may be a faster and more efficient way to get a response.  Please allow 48 business hours for a response.  Please remember that this is for non-urgent requests.   Due to recent changes in healthcare laws, you may see the results of your imaging and laboratory studies on MyChart before your provider has had a chance to review them.  We understand that in some cases there may be results that are confusing or concerning to you. Not all laboratory results come back in the same time frame and the provider may be waiting for multiple results in order to interpret others.  Please give [place] 48 hours in order for your provider to thoroughly review all the results before contacting the office for clarification of your results.   It was a pleasure to see you today!  Thank you for trusting me with your gastrointestinal care!    Ellouise Newer, PA-C

## 2020-09-12 NOTE — Progress Notes (Signed)
Addendum: Reviewed and agree with assessment and management plan. Song Myre M, MD  

## 2020-09-12 NOTE — Progress Notes (Signed)
Chief Complaint: Abdominal pain, diarrhea, anal fistula/abscess  HPI:    Glen Cain is a 35 year old male with a past medical history as listed below, who was referred to me by Eulas Post, MD for a complaint of abdominal pain, diarrhea and recent anal fistula/abscess.    07/17/2020 patient presented to the urgent care with diarrhea.  At that time was noted for the past couple of months he had trouble with a rectal abscess, fissure and hemorrhoids.  He had recurring problems with pain and infection was treated with amoxicillin, Augmentin, Cipro and Flagyl.  He had developed diarrhea 10 times a day.  He was tested for C. difficile and 2 test came back negative.  He continued to experience abdominal pain, nausea, diarrhea, night sweats.    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 Johney Maine.    Today, the patient presents to clinic and tells me that at the very end of January/February he started with what he thought was a hemorrhoid and turned out to be an abscess and has had multiple issues with his rectum since then leading to surgery as above.  He is still healing from this and is recheck with Dr. Johney Maine in 6 weeks.      Explains that during this time he did have a 2-week period of diarrhea, abdominal pain and nausea.  This occurred after eating out at one of the restaurants he eats at typically.  Explains that since then it has all gone away, this was back in April.  He has not had any problems since then and is having regular stools and no further abdominal pain or nausea.  Does recall a similar episode of nausea abdominal pain and diarrhea for 24 hours after eating at the same restaurant and having them Brussels sprouts.  Tells me that he was also on multiple antibiotics for his abscess at the time and had been on previous antibiotics.  C. difficile testing was negative as above.    Denies fever, chills,  blood in his stool, weight loss, family history of IBD or symptoms that awaken him from sleep.  Past Medical History:  Diagnosis Date   Benign neoplasm of skin, site unspecified 08/22/2009    Past Surgical History:  Procedure Laterality Date   ARTHROSCOPIC REPAIR ACL Right 07   CHOLECYSTECTOMY  12/15   EVALUATION UNDER ANESTHESIA WITH HEMORRHOIDECTOMY N/A 08/16/2020   Procedure: ANORECTAL EXAM UNDER ANESTHESIA, anterior superficial fistulotomy with marsupialization, excision of posterior anal sinus tract, excision of anal tag;  Surgeon: Michael Boston, MD;  Location: [facility];  Service: General;  Laterality: N/A;   KNEE ARTHROSCOPY Right 06/16/2013   Procedure: RIGHT ARTHROSCOPY KNEE MENISECTOMY WITH DEBRIDEMENT ;  Surgeon: Marin Shutter, MD;  Location: [place];  Service: Orthopedics;  Laterality: Right;   WISDOM TOOTH EXTRACTION      Current Outpatient Medications  Medication Sig Dispense Refill   acetaminophen (TYLENOL) 500 MG tablet Take 1,000 mg by mouth every 6 (six) hours as needed for moderate pain.     diazepam (VALIUM) 5 MG tablet Take 5 mg by mouth in the morning and at bedtime.     ibuprofen (ADVIL) 200 MG tablet Take 600-800 mg by mouth every 6 (six) hours as needed for moderate pain.     Multiple Vitamin (MULTIVITAMIN) tablet Take 1 tablet by mouth daily.     oxyCODONE (OXY IR/ROXICODONE) 5 MG immediate release tablet Take 1-2  tablets (5-10 mg total) by mouth every 6 (six) hours as needed for moderate pain, severe pain or breakthrough pain. 30 tablet 0   No current facility-administered medications for this visit.    Allergies as of 09/12/2020   (No Known Allergies)    Family History  Problem Relation Age of Onset   Diabetes Other        fhx   Hypertension Other        fhx   Cancer Other        fhx   Stroke Other        fhx    Social History   Socioeconomic History   Marital status: Married    Spouse name: Not on file   Number of children: Not on file   Years  of education: Not on file   Highest education level: Not on file  Occupational History   Not on file  Tobacco Use   Smoking status: Former    Pack years: 0.00    Types: Cigarettes    Quit date: 06/14/2009    Years since quitting: 11.2   Smokeless tobacco: Former   Tobacco comments:    Risk analyst Use: Never used  Substance and Sexual Activity   Alcohol use: Yes   Drug use: No   Sexual activity: Not on file  Other Topics Concern   Not on file  Social History Narrative   Not on file   Social Determinants of Health   Financial Resource Strain: Not on file  Food Insecurity: Not on file  Transportation Needs: Not on file  Physical Activity: Not on file  Stress: Not on file  Social Connections: Not on file  Intimate Partner Violence: Not on file    Review of Systems:    Constitutional: No weight loss, fever or chills Skin: No rash or itching Cardiovascular: No chest pain Respiratory: No SOB Gastrointestinal: See HPI and otherwise negative Genitourinary: No dysuria Neurological: No headache Musculoskeletal: No new muscle or joint pain Hematologic: No bleeding  Psychiatric: No history of depression or anxiety   Physical Exam:  Vital signs: BP 110/70   Pulse 84   Ht 5' 10"  (1.778 m)   Wt 200 lb (90.7 kg)   SpO2 97%   BMI 28.70 kg/m    Constitutional:   Pleasant Caucasian male appears to be in NAD, Well developed, Well nourished, alert and cooperative Head:  Normocephalic and atraumatic. Eyes:   PEERL, EOMI. No icterus. Conjunctiva pink. Ears:  Normal auditory acuity. Neck:  Supple Throat: Oral cavity and pharynx without inflammation, swelling or lesion.  Respiratory: Respirations even and unlabored. Lungs clear to auscultation bilaterally.   No wheezes, crackles, or rhonchi.  Cardiovascular: Normal S1, S2. No MRG. Regular rate and rhythm. No peripheral edema, cyanosis or pallor.  Gastrointestinal:  Soft, nondistended, nontender. No rebound or  guarding. Normal bowel sounds. No appreciable masses or hepatomegaly. Rectal:  Not performed.  Msk:  Symmetrical without gross deformities. Without edema, no deformity or joint abnormality.  Neurologic:  Alert and  oriented x4;  grossly normal neurologically.  Skin:   Dry and intact without significant lesions or rashes. Psychiatric: Demonstrates good judgement and reason without abnormal affect or behaviors.  RELEVANT LABS AND IMAGING: CBC    Component Value Date/Time   WBC 8.3 08/10/2020 0936   RBC 4.74 08/10/2020 0936   HGB 14.1 08/10/2020 0936   HCT 42.0 08/10/2020 0936   PLT 271 08/10/2020 0936  MCV 88.6 08/10/2020 0936   MCH 29.7 08/10/2020 0936   MCHC 33.6 08/10/2020 0936   RDW 13.2 08/10/2020 0936   LYMPHSABS 2.0 06/08/2019 0750   MONOABS 0.8 06/08/2019 0750   EOSABS 0.2 06/08/2019 0750   BASOSABS 0.0 06/08/2019 0750    CMP     Component Value Date/Time   NA 140 06/08/2019 0750   K 4.1 06/08/2019 0750   CL 106 06/08/2019 0750   CO2 27 06/08/2019 0750   GLUCOSE 91 06/08/2019 0750   BUN 11 06/08/2019 0750   CREATININE 0.92 06/08/2019 0750   CALCIUM 9.2 06/08/2019 0750   PROT 6.9 06/08/2019 0750   ALBUMIN 4.2 06/08/2019 0750   AST 16 06/08/2019 0750   ALT 18 06/08/2019 0750   ALKPHOS 62 06/08/2019 0750   BILITOT 0.7 06/08/2019 0750   GFRNONAA >90 06/14/2013 0828   GFRAA >90 06/14/2013 0828    Assessment: 1.  Generalized abdominal pain and diarrhea: 1 episode for about 10 days of abdominal pain diarrhea and nausea 24 hours after eating out, has since resolved over the past 2 months; consider virus versus E. coli versus antibiotics patient was on versus IBD 2.  History of perirectal abscess and fistula: Recent fistulotomy, status post multiple antibiotics, follow-up with surgery in 6 weeks; consider underlying IBD  Plan: 1.  Due to patient's history of perirectal disease recommend he have a colonoscopy for further evaluation of possible underlying IBD.  Patient  was scheduled for a colonoscopy with Dr. Hilarie Fredrickson in the [facility].  We will wait 2 months to allow healing after his recent rectal surgery.  Did provide the patient a detailed list of risks for the procedure and he agrees to proceed. 2.  Patient to follow in clinic per recommendations after time of colonoscopy.  Ellouise Newer, PA-C [facility] 09/12/2020, 8:28 AM  Cc: Eulas Post, MD

## 2020-09-25 ENCOUNTER — Other Ambulatory Visit: Payer: Self-pay | Admitting: Physician Assistant

## 2020-12-05 ENCOUNTER — Encounter: Payer: Commercial Managed Care - PPO | Admitting: Internal Medicine

## 2020-12-17 ENCOUNTER — Encounter: Payer: Self-pay | Admitting: Family Medicine

## 2020-12-17 ENCOUNTER — Other Ambulatory Visit: Payer: Self-pay

## 2020-12-17 ENCOUNTER — Ambulatory Visit (INDEPENDENT_AMBULATORY_CARE_PROVIDER_SITE_OTHER): Payer: Commercial Managed Care - PPO | Admitting: Family Medicine

## 2020-12-17 ENCOUNTER — Encounter: Payer: Commercial Managed Care - PPO | Admitting: Internal Medicine

## 2020-12-17 VITALS — BP 118/70 | HR 85 | Temp 98.4°F | Ht 70.0 in | Wt 210.5 lb

## 2020-12-17 DIAGNOSIS — Z0000 Encounter for general adult medical examination without abnormal findings: Secondary | ICD-10-CM

## 2020-12-17 LAB — HEPATIC FUNCTION PANEL
ALT: 19 U/L (ref 0–53)
AST: 17 U/L (ref 0–37)
Albumin: 4.3 g/dL (ref 3.5–5.2)
Alkaline Phosphatase: 77 U/L (ref 39–117)
Bilirubin, Direct: 0.1 mg/dL (ref 0.0–0.3)
Total Bilirubin: 0.9 mg/dL (ref 0.2–1.2)
Total Protein: 7.5 g/dL (ref 6.0–8.3)

## 2020-12-17 LAB — LIPID PANEL
Cholesterol: 133 mg/dL (ref 0–200)
HDL: 34.4 mg/dL — ABNORMAL LOW (ref >39.00)
LDL Cholesterol: 76 mg/dL (ref 0–99)
NonHDL: 98.48
Total CHOL/HDL Ratio: 4
Triglycerides: 112 mg/dL (ref 0.0–149.0)
VLDL: 22.4 mg/dL (ref 0.0–40.0)

## 2020-12-17 LAB — CBC WITH DIFFERENTIAL/PLATELET
Basophils Absolute: 0 10*3/uL (ref 0.0–0.1)
Basophils Relative: 0.2 % (ref 0.0–3.0)
Eosinophils Absolute: 0.1 10*3/uL (ref 0.0–0.7)
Eosinophils Relative: 1.3 % (ref 0.0–5.0)
HCT: 43.3 % (ref 39.0–52.0)
Hemoglobin: 15 g/dL (ref 13.0–17.0)
Lymphocytes Relative: 20.7 % (ref 12.0–46.0)
Lymphs Abs: 2 10*3/uL (ref 0.7–4.0)
MCHC: 34.6 g/dL (ref 30.0–36.0)
MCV: 87.4 fl (ref 78.0–100.0)
Monocytes Absolute: 0.9 10*3/uL (ref 0.1–1.0)
Monocytes Relative: 9.7 % (ref 3.0–12.0)
Neutro Abs: 6.5 10*3/uL (ref 1.4–7.7)
Neutrophils Relative %: 68.1 % (ref 43.0–77.0)
Platelets: 234 10*3/uL (ref 150.0–400.0)
RBC: 4.96 Mil/uL (ref 4.22–5.81)
RDW: 13.3 % (ref 11.5–15.5)
WBC: 9.6 10*3/uL (ref 4.0–10.5)

## 2020-12-17 LAB — TSH: TSH: 1.67 u[IU]/mL (ref 0.35–5.50)

## 2020-12-17 LAB — BASIC METABOLIC PANEL
BUN: 12 mg/dL (ref 6–23)
CO2: 26 mEq/L (ref 19–32)
Calcium: 9.4 mg/dL (ref 8.4–10.5)
Chloride: 104 mEq/L (ref 96–112)
Creatinine, Ser: 1.01 mg/dL (ref 0.40–1.50)
GFR: 95.18 mL/min (ref >60.00)
Glucose, Bld: 90 mg/dL (ref 70–99)
Potassium: 4.3 mEq/L (ref 3.5–5.1)
Sodium: 138 mEq/L (ref 135–145)

## 2020-12-17 NOTE — Progress Notes (Signed)
Established Patient Office Visit  Subjective:  Patient ID: Glen Cain, male    DOB: November 11, 1985  Age: 35 y.o. MRN: [id number]  CC:  Chief Complaint  Patient presents with   Annual Exam    No new concerns     HPI Glen Cain presents for annual physical exam.  He battled some issues with perirectal fistula and abscess last year and underwent surgery 08/16/2020 for anterior perirectal fistula which was fortunately superficial and posterior perirectal sinus tract.  He has done well since the surgery.  Trying to eat high-fiber diet.  He has discussed with GI possible colonoscopy later this year.  Generally fairly healthy.  Takes no regular medications.  Try to build back his exercise currently walking couple days per week and recently resumed some cough.  Health maintenance reviewed  -Tetanus due 2031 -No risk factors for hepatitis C -Declines flu vaccine today.  He plans to get later this fall.  Family history-significant for father having hypertension history.  No family history of premature CAD.  He is married has 88-monthold son.  Quit smoking 2011  Past Medical History:  Diagnosis Date   Benign neoplasm of skin, site unspecified 08/22/2009    Past Surgical History:  Procedure Laterality Date   ARTHROSCOPIC REPAIR ACL Right 07   CHOLECYSTECTOMY  12/15   EVALUATION UNDER ANESTHESIA WITH HEMORRHOIDECTOMY N/A 08/16/2020   Procedure: ANORECTAL EXAM UNDER ANESTHESIA, anterior superficial fistulotomy with marsupialization, excision of posterior anal sinus tract, excision of anal tag;  Surgeon: GMichael Boston MD;  Location: [facility];  Service: General;  Laterality: N/A;   KNEE ARTHROSCOPY Right 06/16/2013   Procedure: RIGHT ARTHROSCOPY KNEE MENISECTOMY WITH DEBRIDEMENT ;  Surgeon: KMarin Shutter MD;  Location: [place]  Service: Orthopedics;  Laterality: Right;   WISDOM TOOTH EXTRACTION      Family History  Problem Relation Age of Onset   Diabetes Other        fhx    Hypertension Other        fhx   Cancer Other        fhx   Stroke Other        fhx   Colon cancer Neg Hx    Pancreatic cancer Neg Hx    Esophageal cancer Neg Hx     Social History   Socioeconomic History   Marital status: Married    Spouse name: Not on file   Number of children: Not on file   Years of education: Not on file   Highest education level: Not on file  Occupational History   Not on file  Tobacco Use   Smoking status: Former    Types: Cigarettes    Quit date: 06/14/2009    Years since quitting: 11.5   Smokeless tobacco: Former   Tobacco comments:    occ  Vaping Use   Vaping Use: Never used  Substance and Sexual Activity   Alcohol use: Yes   Drug use: No   Sexual activity: Not on file  Other Topics Concern   Not on file  Social History Narrative   Not on file   Social Determinants of Health   Financial Resource Strain: Not on file  Food Insecurity: Not on file  Transportation Needs: Not on file  Physical Activity: Not on file  Stress: Not on file  Social Connections: Not on file  Intimate Partner Violence: Not on file    Outpatient Medications Prior to Visit  Medication Sig Dispense Refill  acetaminophen (TYLENOL) 500 MG tablet Take 1,000 mg by mouth every 6 (six) hours as needed for moderate pain.     Multiple Vitamin (MULTIVITAMIN) tablet Take 1 tablet by mouth daily.     PLENVU 140 g SOLR USE AS DIRECTED. MANUFACTURER[employer]S COUPON UNIVERSAL COUPON CODE:BIN: [id number] GROUP: [id number] PCN: [id number] ID: [id number] PAY NO MORE $50 NO PRIOR AUTHORIZATION 3 each 0   No facility-administered medications prior to visit.    No Known Allergies  ROS Review of Systems  Constitutional:  Negative for activity change, appetite change, fatigue, fever and unexpected weight change.  HENT:  Negative for congestion, ear pain and trouble swallowing.   Eyes:  Negative for pain and visual disturbance.  Respiratory:  Negative for cough, shortness of breath and wheezing.    Cardiovascular:  Negative for chest pain and palpitations.  Gastrointestinal:  Negative for abdominal distention, abdominal pain, blood in stool, constipation, diarrhea, nausea, rectal pain and vomiting.  Genitourinary:  Negative for dysuria, hematuria and testicular pain.  Musculoskeletal:  Negative for arthralgias and joint swelling.  Skin:  Negative for rash.  Neurological:  Negative for dizziness, syncope and headaches.  Hematological:  Negative for adenopathy.  Psychiatric/Behavioral:  Negative for confusion and dysphoric mood.      Objective:    Physical Exam Vitals reviewed.  Constitutional:      Appearance: Normal appearance.  HENT:     Right Ear: Tympanic membrane normal.     Left Ear: Tympanic membrane normal.  Cardiovascular:     Rate and Rhythm: Normal rate and regular rhythm.     Heart sounds:    No gallop.  Pulmonary:     Effort: Pulmonary effort is normal.     Breath sounds: Normal breath sounds.  Abdominal:     Palpations: Abdomen is soft. There is no mass.     Tenderness: There is no abdominal tenderness. There is no guarding or rebound.  Musculoskeletal:     Cervical back: Neck supple.     Right lower leg: No edema.     Left lower leg: No edema.  Lymphadenopathy:     Cervical: No cervical adenopathy.  Skin:    Findings: No rash.  Neurological:     General: No focal deficit present.     Mental Status: He is alert.    BP 118/70 (BP Location: Left Arm, Patient Position: Sitting, Cuff Size: Normal)   Pulse 85   Temp 98.4 F (36.9 C) (Oral)   Ht 5' 10"  (1.778 m)   Wt 210 lb 8 oz (95.5 kg)   SpO2 97%   BMI 30.20 kg/m  Wt Readings from Last 3 Encounters:  12/17/20 210 lb 8 oz (95.5 kg)  09/12/20 200 lb (90.7 kg)  08/16/20 190 lb (86.2 kg)     Health Maintenance Due  Topic Date Due   COVID-19 Vaccine (3 - Pfizer risk series) 07/28/2019    There are no preventive care reminders to display for this patient.  Lab Results  Component Value Date    TSH 1.59 06/08/2019   Lab Results  Component Value Date   WBC 8.3 08/10/2020   HGB 14.1 08/10/2020   HCT 42.0 08/10/2020   MCV 88.6 08/10/2020   PLT 271 08/10/2020   Lab Results  Component Value Date   NA 140 06/08/2019   K 4.1 06/08/2019   CO2 27 06/08/2019   GLUCOSE 91 06/08/2019   BUN 11 06/08/2019   CREATININE 0.92 06/08/2019   BILITOT 0.7 06/08/2019  ALKPHOS 62 06/08/2019   AST 16 06/08/2019   ALT 18 06/08/2019   PROT 6.9 06/08/2019   ALBUMIN 4.2 06/08/2019   CALCIUM 9.2 06/08/2019   GFR 93.23 06/08/2019   Lab Results  Component Value Date   CHOL 122 06/08/2019   Lab Results  Component Value Date   HDL 31.70 (L) 06/08/2019   Lab Results  Component Value Date   LDLCALC 71 06/08/2019   Lab Results  Component Value Date   TRIG 97.0 06/08/2019   Lab Results  Component Value Date   CHOLHDL 4 06/08/2019   No results found for: HGBA1C    Assessment & Plan:   Problem List Items Addressed This Visit   None Visit Diagnoses     Physical exam    -  Primary   Relevant Orders   Basic metabolic panel   Lipid panel   CBC with Differential/Platelet   TSH   Hepatic function panel     Generally healthy 35 year old male.  He had lost substantial weight with issues related to perirectal fistula and a bout of diarrhea he had and basically has gained back to his baseline at this time.  He realizes he needs to lose little bit of weight at this time and has recently started back some exercise.  -Reminder to consider flu vaccine this fall -Obtain screening labs as above -Recommend minimum 150 minutes/week of moderately strenuous activity  No orders of the defined types were placed in this encounter.   Follow-up: No follow-ups on file.    Carolann Littler, MD

## 2021-03-11 ENCOUNTER — Ambulatory Visit (AMBULATORY_SURGERY_CENTER): Payer: Commercial Managed Care - PPO

## 2021-03-11 ENCOUNTER — Other Ambulatory Visit: Payer: Self-pay

## 2021-03-11 VITALS — Ht 70.0 in | Wt 205.0 lb

## 2021-03-11 DIAGNOSIS — R1084 Generalized abdominal pain: Secondary | ICD-10-CM

## 2021-03-11 DIAGNOSIS — R197 Diarrhea, unspecified: Secondary | ICD-10-CM

## 2021-03-11 DIAGNOSIS — K611 Rectal abscess: Secondary | ICD-10-CM

## 2021-03-11 MED ORDER — PLENVU 140 G PO SOLR: 1.0000 | ORAL | 0 refills | Status: DC

## 2021-03-11 NOTE — Progress Notes (Signed)
    Patient's pre-visit was done today over the phone with the patient   Name,DOB and address verified.   Patient denies any allergies to Eggs and Soy.  Patient denies any problems with anesthesia/sedation. Patient denies taking diet pills or blood thinners.  Denies atrial flutter or atrial fib Denies chronic constipation No home Oxygen.   Packet of Prep instructions mailed to patient including a copy of a consent form-pt is aware.  Patient understands to call us back with any questions or concerns.  Patient is aware of our care-partner policy and [id number] safety protocol.   Due to quick turn around for procedure, pt will come and pick up instructions 12/15.  Also sent to my chart.

## 2021-03-12 ENCOUNTER — Encounter: Payer: Self-pay | Admitting: Internal Medicine

## 2021-03-21 ENCOUNTER — Other Ambulatory Visit: Payer: Self-pay

## 2021-03-21 ENCOUNTER — Ambulatory Visit (AMBULATORY_SURGERY_CENTER): Payer: Commercial Managed Care - PPO | Admitting: Internal Medicine

## 2021-03-21 ENCOUNTER — Encounter: Payer: Self-pay | Admitting: Internal Medicine

## 2021-03-21 VITALS — BP 112/70 | HR 69 | Temp 98.6°F | Resp 13 | Ht 70.0 in | Wt 205.0 lb

## 2021-03-21 DIAGNOSIS — D124 Benign neoplasm of descending colon: Secondary | ICD-10-CM

## 2021-03-21 DIAGNOSIS — K648 Other hemorrhoids: Secondary | ICD-10-CM

## 2021-03-21 DIAGNOSIS — K529 Noninfective gastroenteritis and colitis, unspecified: Secondary | ICD-10-CM

## 2021-03-21 DIAGNOSIS — R1084 Generalized abdominal pain: Secondary | ICD-10-CM

## 2021-03-21 DIAGNOSIS — Z8719 Personal history of other diseases of the digestive system: Secondary | ICD-10-CM | POA: Diagnosis not present

## 2021-03-21 DIAGNOSIS — R197 Diarrhea, unspecified: Secondary | ICD-10-CM

## 2021-03-21 DIAGNOSIS — K611 Rectal abscess: Secondary | ICD-10-CM

## 2021-03-21 DIAGNOSIS — K635 Polyp of colon: Secondary | ICD-10-CM | POA: Diagnosis not present

## 2021-03-21 DIAGNOSIS — K633 Ulcer of intestine: Secondary | ICD-10-CM | POA: Diagnosis not present

## 2021-03-21 MED ORDER — SODIUM CHLORIDE 0.9 % IV SOLN: 500.0000 mL | Freq: Once | INTRAVENOUS | Status: DC

## 2021-03-21 NOTE — Progress Notes (Signed)
Pt's states no medical or surgical changes since previsit or office visit. VS assessed by C.W

## 2021-03-21 NOTE — Progress Notes (Signed)
Called to room to assist during endoscopic procedure.  Patient ID and intended procedure confirmed with present staff. Received instructions for my participation in the procedure from the performing physician.  

## 2021-03-21 NOTE — Op Note (Signed)
[name] Procedure Date: 03/21/2021 11:23 AM MRN: [id number] Endoscopist: Jerene Bears , MD Age: 35 Referring MD:  Date of Birth: April 16, 1985 Gender: Male Account #: [id number] Procedure:                Colonoscopy Indications:              Exclusion of Crohn's disease given personal history                            of perianal abscesses and fistula (anterior                            fistulotomy Sept 2022 - Dr. Johney Maine) Medicines:                Monitored Anesthesia Care Procedure:                Pre-Anesthesia Assessment:                           - Prior to the procedure, a History and Physical                            was performed, and patient medications and                            allergies were reviewed. The patient's tolerance of                            previous anesthesia was also reviewed. The risks                            and benefits of the procedure and the sedation                            options and risks were discussed with the patient.                            All questions were answered, and informed consent                            was obtained. Prior Anticoagulants: The patient has                            taken no previous anticoagulant or antiplatelet                            agents. ASA Grade Assessment: II - A patient with                            mild systemic disease. After reviewing the risks                            and benefits, the patient was deemed in  satisfactory condition to undergo the procedure.                           After obtaining informed consent, the colonoscope                            was passed under direct vision. Throughout the                            procedure, the patient's blood pressure, pulse, and                            oxygen saturations were monitored continuously. The                            CF [id number] #[id number] was  introduced through the anus                            and advanced to the terminal ileum. The colonoscopy                            was performed without difficulty. The patient                            tolerated the procedure well. The quality of the                            bowel preparation was good. The terminal ileum,                            ileocecal valve, appendiceal orifice, and rectum                            were photographed. Scope In: 11:31:01 AM Scope Out: 11:47:02 AM Scope Withdrawal Time: 0 hours 13 minutes 38 seconds  Total Procedure Duration: 0 hours 16 minutes 1 second  Findings:                 An posterior anal fissure was found on perianal                            exam.                           Inflammation, moderate in severity and                            characterized by erosions, erythema, granularity                            and shallow ulcerations was found in the terminal                            ileum and in the ileocecal valve. Biopsies were  taken with a cold forceps for histology.                           A 7 mm polyp was found in the descending colon. The                            polyp was sessile. The polyp was removed with a                            cold snare. Resection and retrieval were complete.                           Normal mucosa was found in the entire colon.                            Biopsies were taken with a cold forceps for                            histology (right and left colon).                           Internal hemorrhoids were found during                            retroflexion. The hemorrhoids were small. Small                            healed fistula seen in distal rectum. Rectum                            otherwise normal without evidence of inflammation. Complications:            No immediate complications. Estimated Blood Loss:     Estimated blood loss was  minimal. Impression:               - Anal fissure found on perianal exam.                           - Ileitis, rule out Crohn's disease. Biopsied.                           - One 7 mm polyp in the descending colon, removed                            with a cold snare. Resected and retrieved.                           - Normal mucosa in the entire examined colon.                            Biopsied.                           - Healed fistula site seen in distal rectum. Small  internal hemorrhoids. No evidence of active                            proctitis. Recommendation:           - Patient has a contact number available for                            emergencies. The signs and symptoms of potential                            delayed complications were discussed with the                            patient. Return to normal activities tomorrow.                            Written discharge instructions were provided to the                            patient.                           - Resume previous diet.                           - Continue present medications.                           - Await pathology results.                           - Office visit with me next available to discuss                            what is likely to be Crohn's disease.                           - Repeat colonoscopy is recommended. The                            colonoscopy date will be determined after pathology                            results from today's exam become available for                            review. Jerene Bears, MD 03/21/2021 11:56:26 AM This report has been signed electronically.

## 2021-03-21 NOTE — Patient Instructions (Addendum)
Resume previous diet and medications.  Await results for final recommendations.  Handouts on findings given to patient.   Office visit with Dr. Hilarie Fredrickson at next available appointment.   YOU HAD AN ENDOSCOPIC PROCEDURE TODAY AT [facility]:   Refer to the procedure report that was given to you for any specific questions about what was found during the examination.  If the procedure report does not answer your questions, please call your gastroenterologist to clarify.  If you requested that your care partner not be given the details of your procedure findings, then the procedure report has been included in a sealed envelope for you to review at your convenience later.  YOU SHOULD EXPECT: Some feelings of bloating in the abdomen. Passage of more gas than usual.  Walking can help get rid of the air that was put into your GI tract during the procedure and reduce the bloating. If you had a lower endoscopy (such as a colonoscopy or flexible sigmoidoscopy) you may notice spotting of blood in your stool or on the toilet paper. If you underwent a bowel prep for your procedure, you may not have a normal bowel movement for a few days.  Please Note:  You might notice some irritation and congestion in your nose or some drainage.  This is from the oxygen used during your procedure.  There is no need for concern and it should clear up in a day or so.  SYMPTOMS TO REPORT IMMEDIATELY:  Following lower endoscopy (colonoscopy or flexible sigmoidoscopy):  Excessive amounts of blood in the stool  Significant tenderness or worsening of abdominal pains  Swelling of the abdomen that is new, acute  Fever of 100F or higher   For urgent or emergent issues, a gastroenterologist can be reached at any hour by calling [phone]. Do not use MyChart messaging for urgent concerns.    DIET:  We do recommend a small meal at first, but then you may proceed to your regular diet.  Drink plenty of fluids but you  should avoid alcoholic beverages for 24 hours.  ACTIVITY:  You should plan to take it easy for the rest of today and you should NOT DRIVE or use heavy machinery until tomorrow (because of the sedation medicines used during the test).    FOLLOW UP: Our staff will call the number listed on your records 48-72 hours following your procedure to check on you and address any questions or concerns that you may have regarding the information given to you following your procedure. If we do not reach you, we will leave a message.  We will attempt to reach you two times.  During this call, we will ask if you have developed any symptoms of COVID 19. If you develop any symptoms (ie: fever, flu-like symptoms, shortness of breath, cough etc.) before then, please call [phone].  If you test positive for Covid 19 in the 2 weeks post procedure, please call and report this information to [place].    If any biopsies were taken you will be contacted by phone or by letter within the next 1-3 weeks.  Please call us at [phone] if you have not heard about the biopsies in 3 weeks.    SIGNATURES/CONFIDENTIALITY: You and/or your care partner have signed paperwork which will be entered into your electronic medical record.  These signatures attest to the fact that that the information above on your After Visit Summary has been reviewed and is understood.  Full responsibility of  the confidentiality of this discharge information lies with you and/or your care-partner.

## 2021-03-21 NOTE — Progress Notes (Signed)
Report given to PACU, vss 

## 2021-03-26 ENCOUNTER — Telehealth: Payer: Self-pay | Admitting: *Deleted

## 2021-03-26 NOTE — Telephone Encounter (Signed)
°  Follow up Call-  Call back number 03/21/2021  Post procedure Call Back phone  # [phone]  Permission to leave phone message Yes  Some recent data might be hidden     Patient questions: Message left to call us if necessary.

## 2021-03-28 ENCOUNTER — Telehealth: Payer: Self-pay | Admitting: Internal Medicine

## 2021-03-28 NOTE — Telephone Encounter (Signed)
Patient returning call, states that he is doing fine since his procedure.

## 2021-03-28 NOTE — Telephone Encounter (Signed)
Patient called stated that he has questions about his results that he got on his Mychart. Seeking advice, Please advise.

## 2021-03-28 NOTE — Telephone Encounter (Signed)
Returned patient's call. Pt received pathology results and had questions about what the results meant. Pt also concerned that follow up appointment was scheduled in February. Rescheduled pt with a sooner appt on 04/23/21 at 3:20 pm with Dr. Hilarie Fredrickson. Told pt pathology results have not been reviewed yet. Pt stated he could wait until follow up appt to discuss pathology results and colonoscopy results if needed.

## 2021-04-03 ENCOUNTER — Other Ambulatory Visit: Payer: Self-pay

## 2021-04-03 DIAGNOSIS — K50019 Crohn's disease of small intestine with unspecified complications: Secondary | ICD-10-CM

## 2021-04-03 NOTE — Telephone Encounter (Signed)
Patient called, this conversation attached to his result note from colonoscopy pathology

## 2021-04-05 NOTE — Telephone Encounter (Signed)
Patient has an MRI scheduled for the 11th and with his new insurance, he will need a preauthorization.  Please call [phone] to expedite authorization.

## 2021-04-08 NOTE — Telephone Encounter (Signed)
Noted  

## 2021-04-10 ENCOUNTER — Ambulatory Visit (HOSPITAL_COMMUNITY)
Admission: RE | Admit: 2021-04-10 | Discharge: 2021-04-10 | Disposition: A | Discharge status: Home / Self Care | Payer: BC Managed Care – PPO | Source: Ambulatory Visit | Attending: Internal Medicine | Admitting: Internal Medicine

## 2021-04-10 ENCOUNTER — Other Ambulatory Visit: Payer: Self-pay

## 2021-04-10 ENCOUNTER — Other Ambulatory Visit (INDEPENDENT_AMBULATORY_CARE_PROVIDER_SITE_OTHER): Payer: BC Managed Care – PPO

## 2021-04-10 DIAGNOSIS — Z79899 Other long term (current) drug therapy: Secondary | ICD-10-CM | POA: Diagnosis not present

## 2021-04-10 DIAGNOSIS — K50019 Crohn's disease of small intestine with unspecified complications: Secondary | ICD-10-CM

## 2021-04-10 DIAGNOSIS — Z7185 Encounter for immunization safety counseling: Secondary | ICD-10-CM | POA: Diagnosis not present

## 2021-04-10 DIAGNOSIS — K5090 Crohn's disease, unspecified, without complications: Secondary | ICD-10-CM | POA: Diagnosis not present

## 2021-04-10 LAB — C-REACTIVE PROTEIN: CRP: 1.5 mg/dL (ref 0.5–20.0)

## 2021-04-10 IMAGING — MR MR ^MR ENTEROGRAPHY W/WO
6 of 15 series · 19 of 48 positions shown · IV contrast (9 ML GAD)
Comparison: None.

CLINICAL DATA: Crohn's ileitis, assessment for extensive disease.
Colonoscopy [DATE] showed inflammation with shallow ulcerations
in the terminal ileum and ileocecal valve.

EXAM:
MR ABDOMEN AND PELVIS WITHOUT AND WITH CONTRAST (MR ENTEROGRAPHY)
TECHNIQUE: Multiplanar, multisequence MRI of the abdomen and pelvis was
performed both before and during bolus administration of intravenous
contrast. Negative oral contrast VoLumen was given.
CONTRAST:  9mL GADAVIST GADOBUTROL 1 MMOL/ML IV SOLN

[Series 4: cor ssfse nav · coronal · 6.0mm · 0.94mm/px · 2 of 45 slices shown]
[im 1/45]
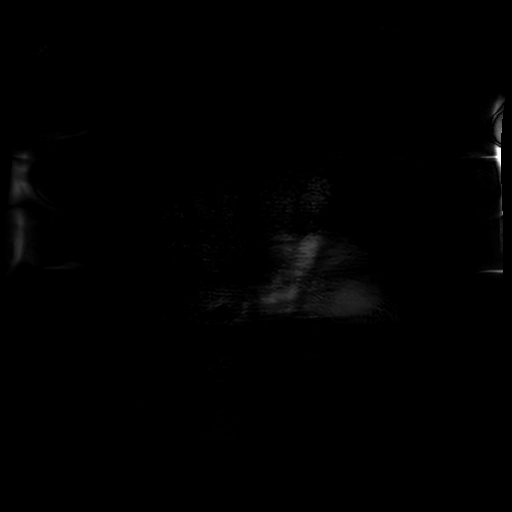
[im 45/45]
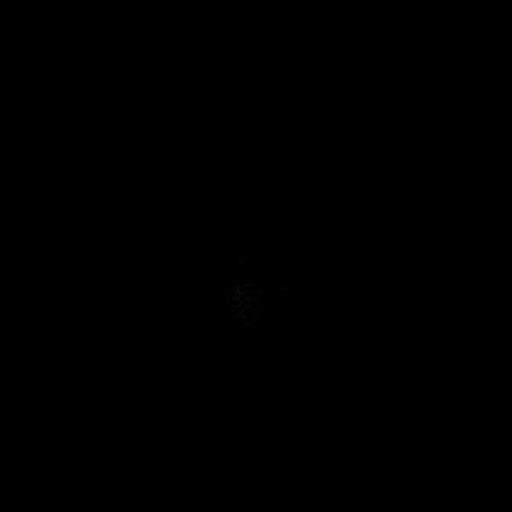

[Series 5: ax ssfse nav · axial · 6.0mm · 0.78mm/px · z∈[-352,+110]mm · 2 of 78 slices shown]
[im 1/78]
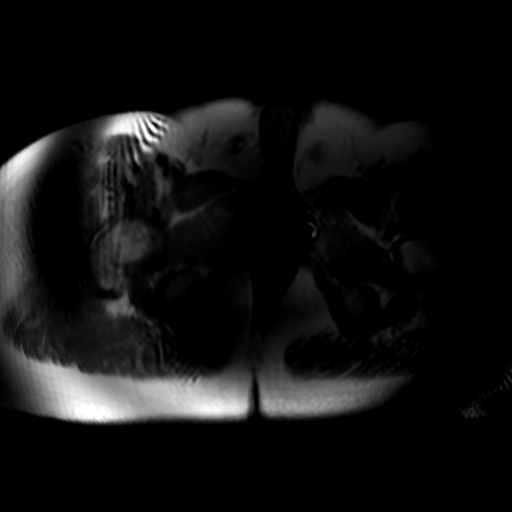
[im 78/78]
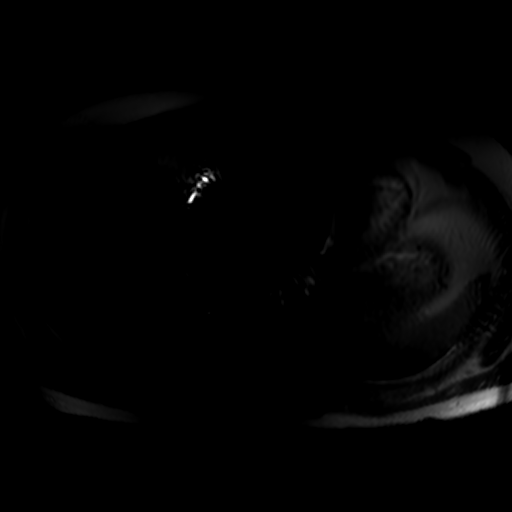

[Series 6: bSSFP · coronal · 6.0mm · 0.94mm/px · 9 of 740 slices shown (1 of 2)]
[im 1/740]
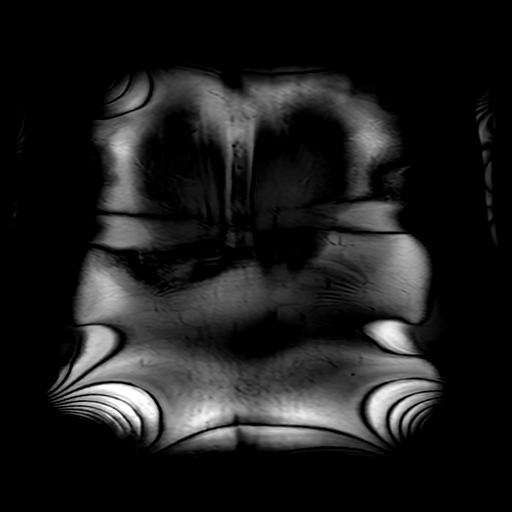
[im 135/740]
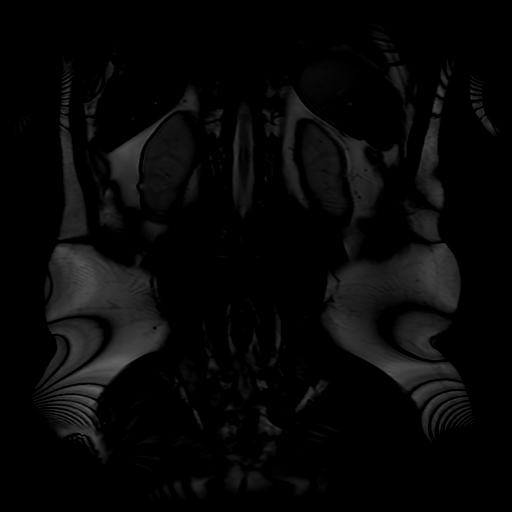
[im 202/740]
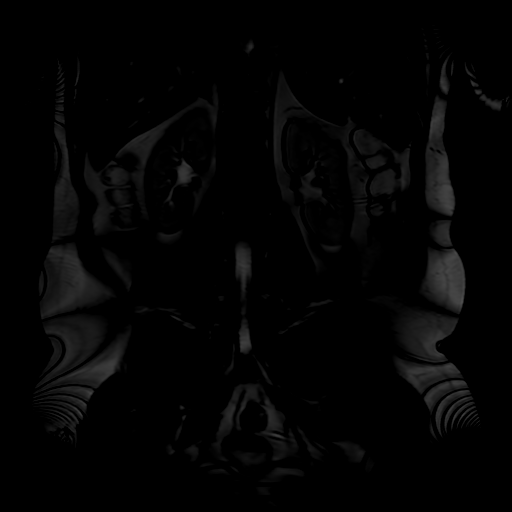
[im 336/740]
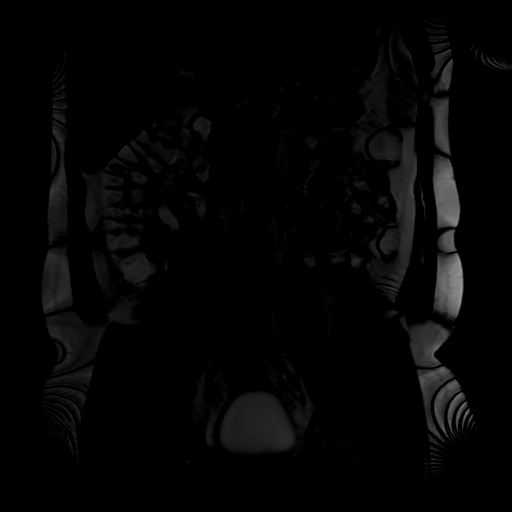
[im 404/740]
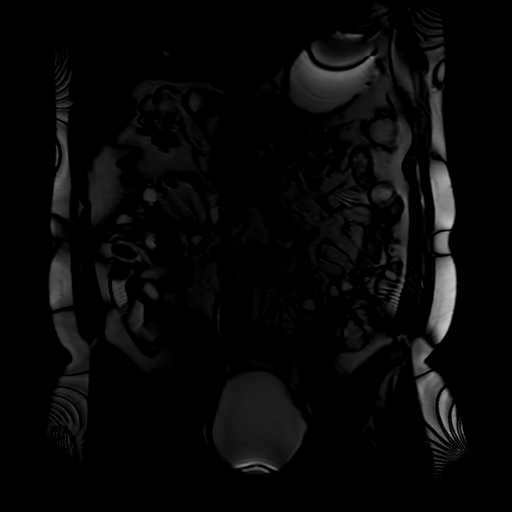
[im 538/740]
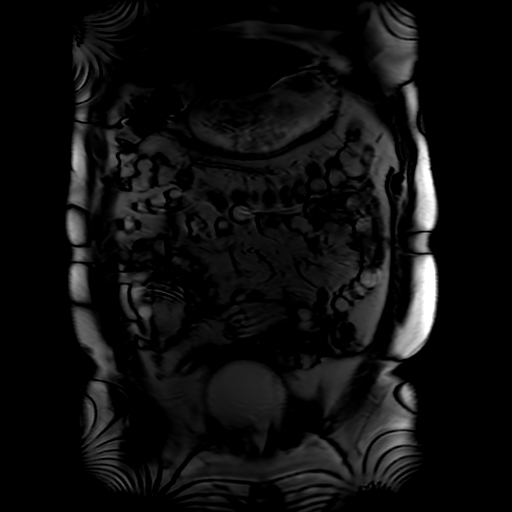
[im 605/740]
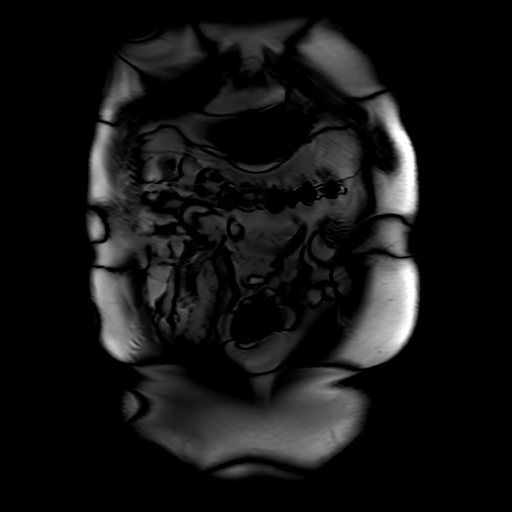
[im 672/740]
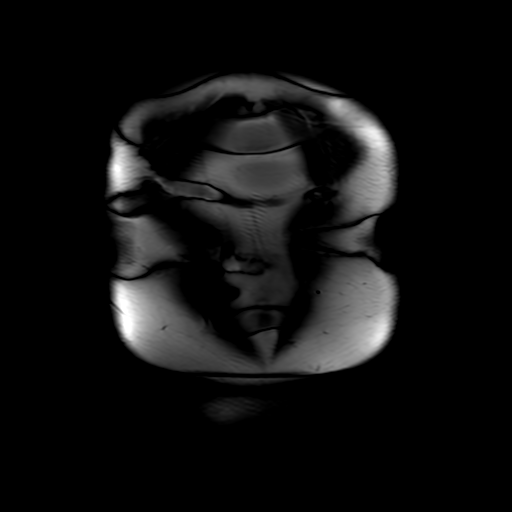
[im 740/740]
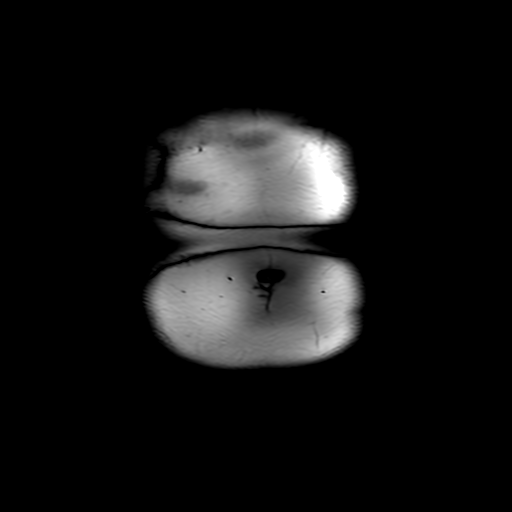

[Series 7: DWI b500 · axial · 8.0mm · 1.95mm/px · z∈[-334,+146]mm · 2 of 98 slices shown]
[im 1/98]
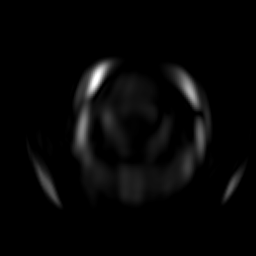
[im 98/98]
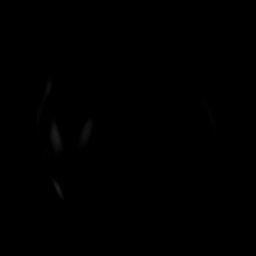

[Series 8: bSSFP · axial · 6.0mm · 0.78mm/px · 1 of 78 slices shown (2 of 2)]
[im 1/78]
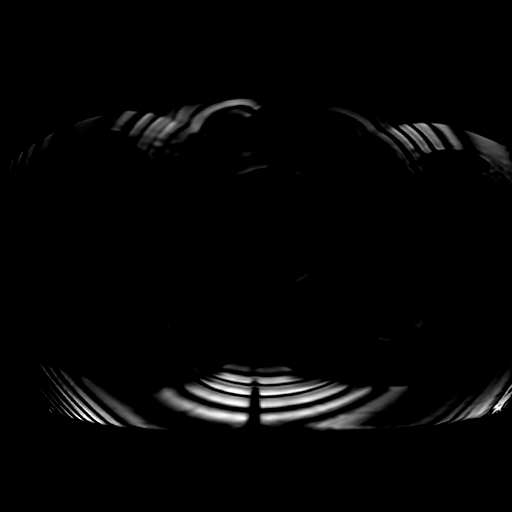

[Series 1101: T1 dynamic post-contrast · axial · non-contrast · 4.0mm · 0.82mm/px · z∈[-369,+101]mm · 3 of 236 slices shown]
[im 1/236]
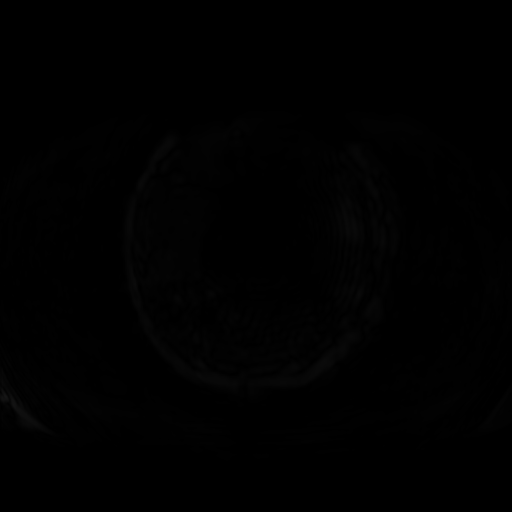
[im 118/236]
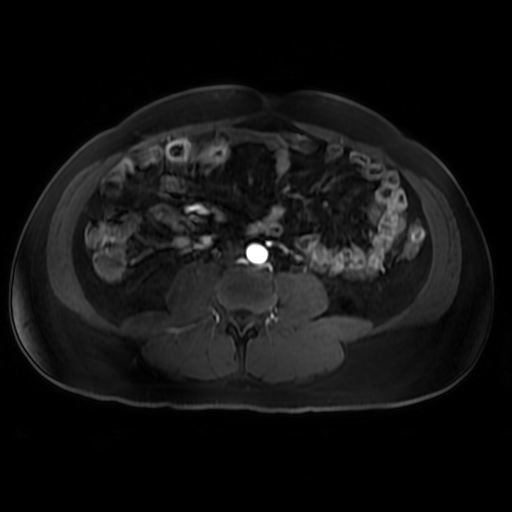
[im 236/236]
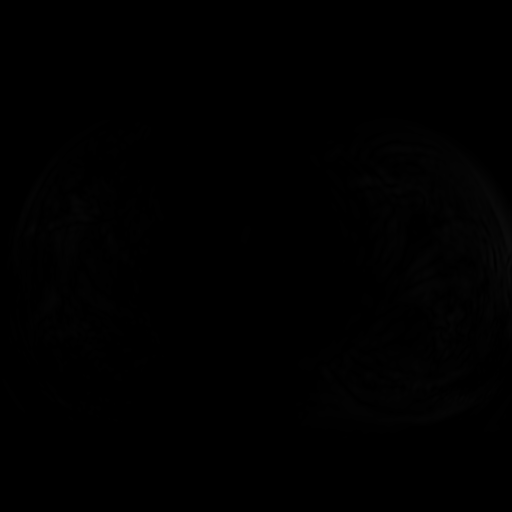

[19 of 48 positions shown; findings below may reference images not displayed]

FINDINGS: COMBINED FINDINGS FOR BOTH MR ABDOMEN AND PELVIS

Lower chest: Unremarkable where included

Hepatobiliary: Gallbladder surgically absent. No biliary dilatation.
Hepatic parenchyma unremarkable where included.

Pancreas:  Unremarkable

Spleen:  Unremarkable

Adrenals/Urinary Tract:  Unremarkable

Stomach/Bowel: Diffuse abnormal wall thickening and accentuated
mucosal enhancement is present in the distal 25 cm of the ileum with
associated fold effacement as shown for example on image 125 series
12 and image 95 series 12. No definite active colonic involvement is
identified although please note that the anal region is obscured due
to boundary artifact.

Normal appearing fold pattern in most of the jejunum. There is at
least 1 collapsed jejunal loop for example on image 105 of series 12
with some questionable accentuation of wall thickening which could
represent low-grade Crohn's disease involvement in general I favor
this is probably being simply a collapsed loop rather than active
involvement.

Visualized stomach unremarkable.  The duodenum appears unremarkable.

No dilated bowel.  No additional significant bowel findings.

Vascular/Lymphatic: Scattered ileocolic lymph nodes as on image 21
series 4, likely reactive. No pathologic adenopathy.

Reproductive: Unremarkable where included.

Other:  No ascites.

Musculoskeletal: Mild disc desiccation at L5-S1.
IMPRESSION: 1. Active Crohn's disease involving a 25 cm segment of the terminal
ileum extending to the ileocecal valve, with associated abnormal
wall thickening, mucosal enhancement, fold effacement, and low-level
edema in the mesentery with scattered regional reactive lymph nodes.
2. Equivocal wall thickening in a loop of jejunum, although more
likely this is simply due to nondistention rather than active skip
lesion involving the jejunum.

## 2021-04-10 IMAGING — MR MR ENTEROGRAPHY W/ CM
7 of 15 series · 20 of 48 positions shown · IV contrast (gadavist)
Comparison: None.

CLINICAL DATA: Crohn's ileitis, assessment for extensive disease.
Colonoscopy [DATE] showed inflammation with shallow ulcerations
in the terminal ileum and ileocecal valve.

EXAM:
MR ABDOMEN AND PELVIS WITHOUT AND WITH CONTRAST (MR ENTEROGRAPHY)
TECHNIQUE: Multiplanar, multisequence MRI of the abdomen and pelvis was
performed both before and during bolus administration of intravenous
contrast. Negative oral contrast VoLumen was given.
CONTRAST:  9mL GADAVIST GADOBUTROL 1 MMOL/ML IV SOLN

[Series 4: cor ssfse nav · coronal · 6.0mm · 0.94mm/px · 2 of 45 slices shown]
[im 1/45]
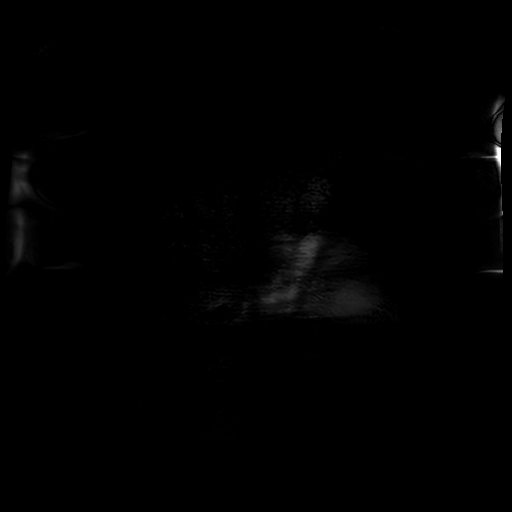
[im 45/45]
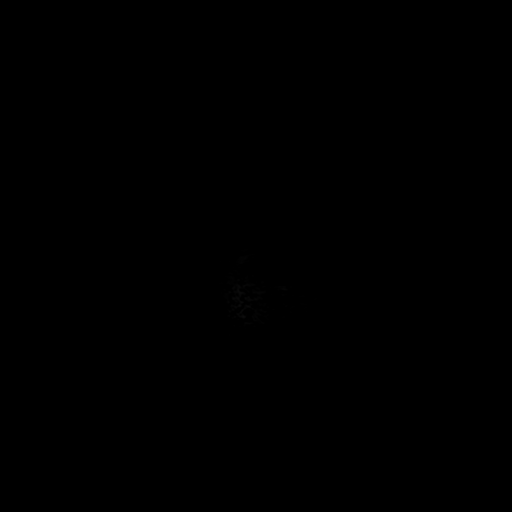

[Series 5: ax ssfse nav · axial · 6.0mm · 0.78mm/px · z∈[-352,+110]mm · 2 of 78 slices shown]
[im 1/78]
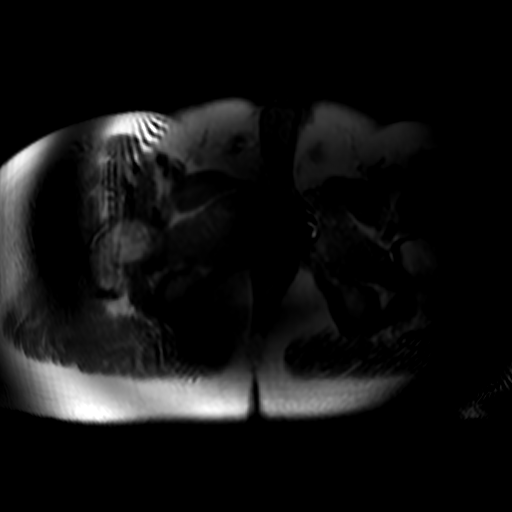
[im 78/78]
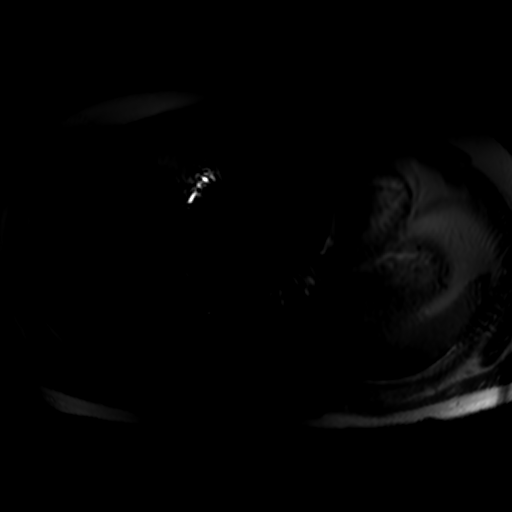

[Series 6: bSSFP · coronal · 6.0mm · 0.94mm/px · 9 of 740 slices shown (1 of 2)]
[im 1/740]
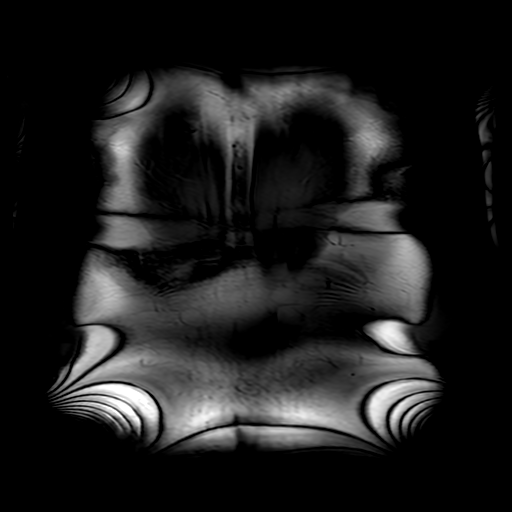
[im 135/740]
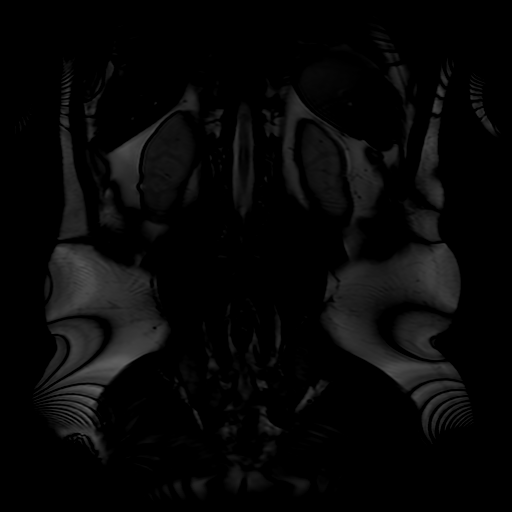
[im 202/740]
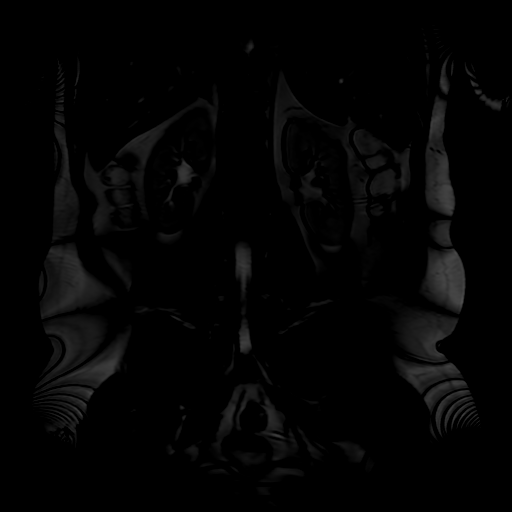
[im 336/740]
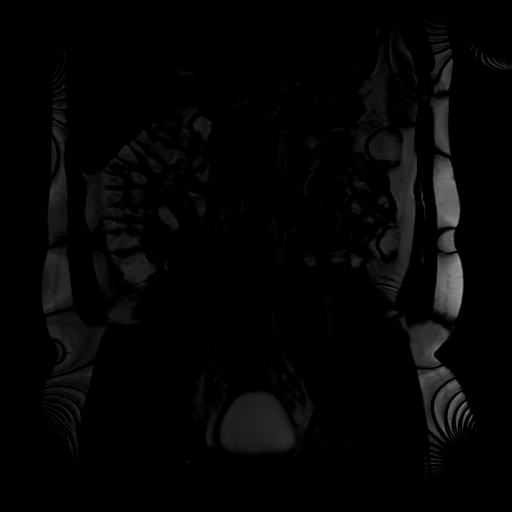
[im 404/740]
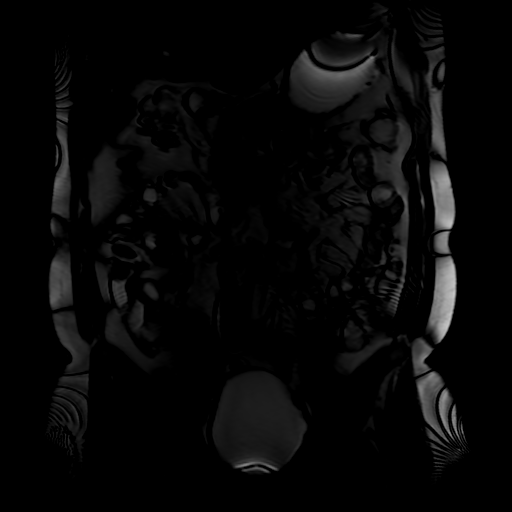
[im 538/740]
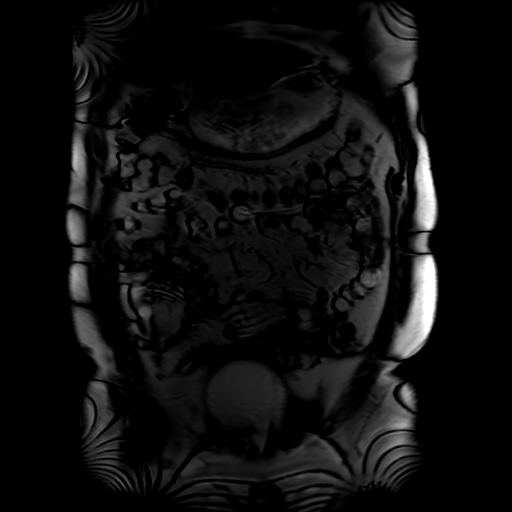
[im 605/740]
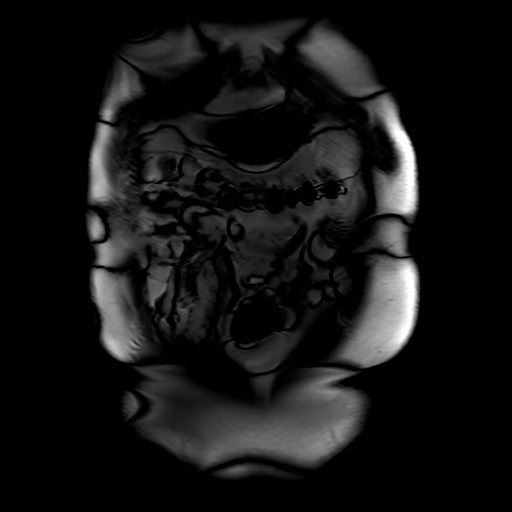
[im 672/740]
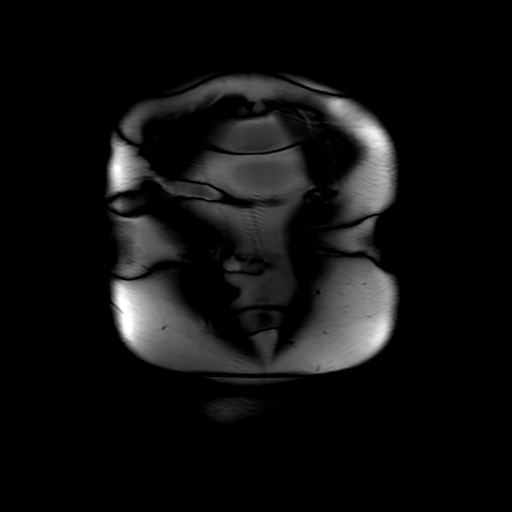
[im 740/740]
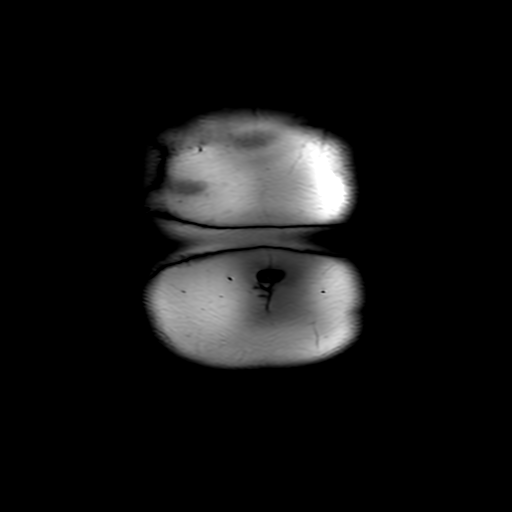

[Series 7: DWI b500 · axial · 8.0mm · 1.95mm/px · z∈[-334,+146]mm · 2 of 98 slices shown]
[im 1/98]
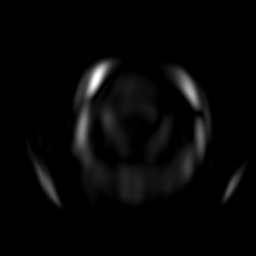
[im 98/98]
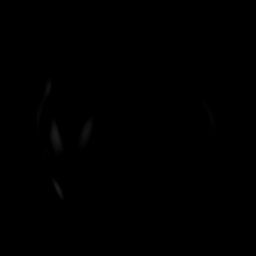

[Series 8: bSSFP · axial · 6.0mm · 0.78mm/px · 1 of 78 slices shown (2 of 2)]
[im 1/78]
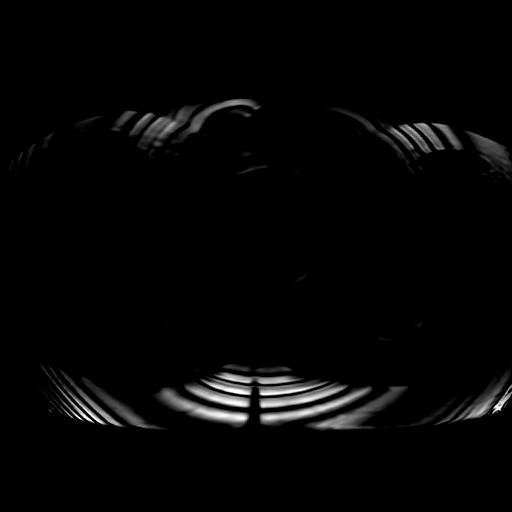

[Series 1101: T1 dynamic post-contrast · axial · non-contrast · 4.0mm · 0.82mm/px · z∈[-369,+101]mm · 3 of 236 slices shown (1 of 2)]
[im 1/236]
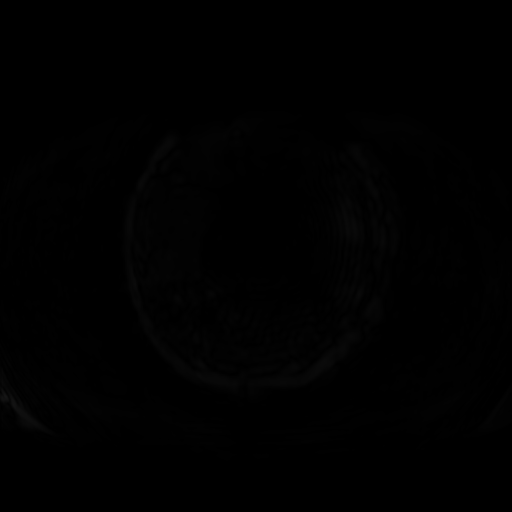
[im 118/236]
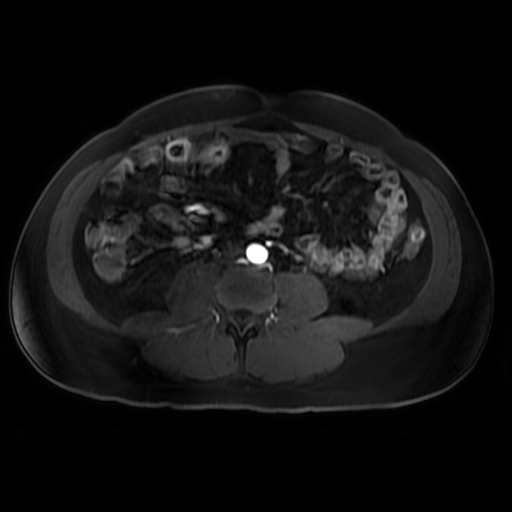
[im 236/236]
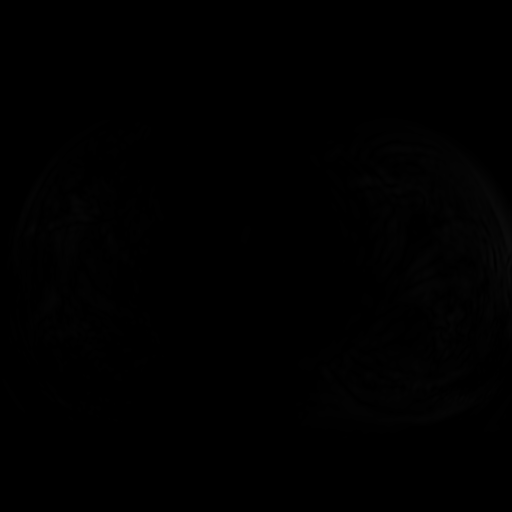

[Series 1102: T1 dynamic post-contrast · axial · non-contrast · 4.0mm · 0.82mm/px · 1 of 236 slices shown (2 of 2)]
[im 1/236]
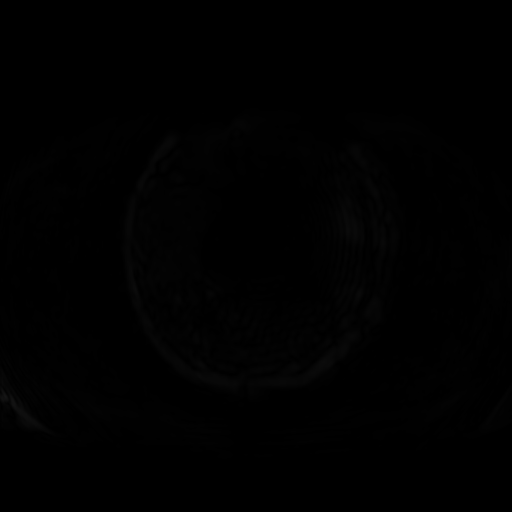

[20 of 48 positions shown; findings below may reference images not displayed]

FINDINGS: COMBINED FINDINGS FOR BOTH MR ABDOMEN AND PELVIS

Lower chest: Unremarkable where included

Hepatobiliary: Gallbladder surgically absent. No biliary dilatation.
Hepatic parenchyma unremarkable where included.

Pancreas:  Unremarkable

Spleen:  Unremarkable

Adrenals/Urinary Tract:  Unremarkable

Stomach/Bowel: Diffuse abnormal wall thickening and accentuated
mucosal enhancement is present in the distal 25 cm of the ileum with
associated fold effacement as shown for example on image 125 series
12 and image 95 series 12. No definite active colonic involvement is
identified although please note that the anal region is obscured due
to boundary artifact.

Normal appearing fold pattern in most of the jejunum. There is at
least 1 collapsed jejunal loop for example on image 105 of series 12
with some questionable accentuation of wall thickening which could
represent low-grade Crohn's disease involvement in general I favor
this is probably being simply a collapsed loop rather than active
involvement.

Visualized stomach unremarkable.  The duodenum appears unremarkable.

No dilated bowel.  No additional significant bowel findings.

Vascular/Lymphatic: Scattered ileocolic lymph nodes as on image 21
series 4, likely reactive. No pathologic adenopathy.

Reproductive: Unremarkable where included.

Other:  No ascites.

Musculoskeletal: Mild disc desiccation at L5-S1.
IMPRESSION: 1. Active Crohn's disease involving a 25 cm segment of the terminal
ileum extending to the ileocecal valve, with associated abnormal
wall thickening, mucosal enhancement, fold effacement, and low-level
edema in the mesentery with scattered regional reactive lymph nodes.
2. Equivocal wall thickening in a loop of jejunum, although more
likely this is simply due to nondistention rather than active skip
lesion involving the jejunum.

## 2021-04-10 MED ORDER — GADOBUTROL 1 MMOL/ML IV SOLN
9.0000 mL | Freq: Once | INTRAVENOUS | Status: AC | PRN
  Administered 2021-04-10: 9 mL via INTRAVENOUS

## 2021-04-11 ENCOUNTER — Encounter: Payer: Self-pay | Admitting: Internal Medicine

## 2021-04-13 LAB — QUANTIFERON-TB GOLD PLUS
Mitogen-NIL: >10 IU/mL
NIL: 0.1 IU/mL
QuantiFERON-TB Gold Plus: NEGATIVE
TB1-NIL: 0.03 IU/mL
TB2-NIL: <0 IU/mL

## 2021-04-13 LAB — HEPATITIS C ANTIBODY
Hepatitis C Ab: NONREACTIVE
SIGNAL TO CUT-OFF: 0.09 (ref <1.00)

## 2021-04-13 LAB — HEPATITIS A ANTIBODY, TOTAL: Hepatitis A AB,Total: REACTIVE — AB

## 2021-04-13 LAB — HEPATITIS B SURFACE ANTIBODY,QUALITATIVE: Hep B S Ab: REACTIVE — AB

## 2021-04-13 LAB — HEPATITIS B SURFACE ANTIGEN: Hepatitis B Surface Ag: NONREACTIVE

## 2021-04-13 LAB — HEPATITIS B CORE ANTIBODY, TOTAL: Hep B Core Total Ab: NONREACTIVE

## 2021-04-15 ENCOUNTER — Encounter: Payer: Self-pay | Admitting: *Deleted

## 2021-04-23 ENCOUNTER — Ambulatory Visit (INDEPENDENT_AMBULATORY_CARE_PROVIDER_SITE_OTHER): Payer: BC Managed Care – PPO | Admitting: Internal Medicine

## 2021-04-23 ENCOUNTER — Encounter: Payer: Self-pay | Admitting: Internal Medicine

## 2021-04-23 VITALS — BP 140/77 | HR 88 | Ht 70.0 in | Wt 219.8 lb

## 2021-04-23 DIAGNOSIS — K50119 Crohn's disease of large intestine with unspecified complications: Secondary | ICD-10-CM | POA: Diagnosis not present

## 2021-04-23 DIAGNOSIS — Z79899 Other long term (current) drug therapy: Secondary | ICD-10-CM | POA: Diagnosis not present

## 2021-04-23 DIAGNOSIS — K50018 Crohn's disease of small intestine with other complication: Secondary | ICD-10-CM

## 2021-04-23 DIAGNOSIS — Z7185 Encounter for immunization safety counseling: Secondary | ICD-10-CM

## 2021-04-23 MED ORDER — AZATHIOPRINE 50 MG PO TABS: 50.0000 mg | ORAL_TABLET | Freq: Every day | ORAL | 1 refills | Status: DC

## 2021-04-23 NOTE — Progress Notes (Signed)
Subjective:    Patient ID: Glen Cain, male    DOB: Jun 21, 1985, 36 y.o.   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  He is here today with his wife.  I performed a colonoscopy for him on 03/21/2021 and he very recently had an MR enterography.  Colonoscopy revealed a posterior anal fissure.  There was inflammation moderate in severity in the terminal ileum and at the ileocecal valve.  This was biopsied.  A 7 mm descending polyp was removed.  The colon appeared normal.  Internal hemorrhoids were seen on retroflexion and there was evidence of a healed fistula in the distal rectum.  Pathology = terminal ileum: Ulceration and active inflammation consistent with Crohn's disease.  Right colon biopsies were unremarkable.  Left colon biopsies unremarkable.  Descending colon polyp was hyperplastic.  MR enterography performed on 04/10/2021 revealed active Crohn's disease involving a 25 cm segment of terminal ileum extending to the ileocecal valve with abnormal wall thickening, mucosal enhancement, fold effacement and low level edema in the mesentery with scattered regional reactive lymph nodes.  There was an equivocal loop in the jejunum with wall thickening felt more likely to be nondistention rather than an active skip lesion involving the jejunum.  He reports that he has been mostly feeling well though last night after eating macaroni and cheese, fried chicken and fried pickles he developed crampy abdominal pain with bloating followed by nausea and vomiting and loose stools.  Stools nonbloody and not melenic.  This occurred around 2 AM.  He had 2 similar episodes earlier in the year after eating Brussels sprouts on both occasions.  Outside of these episodes he is not having abdominal pain nor diarrhea.   He will occasionally have right-sided abdominal pain but has not had this in many months.  No perianal pain or further drainage.  He has read about Crohn's disease and both he and his wife have questions regarding therapy and natural history.  They have recently learned that his wife is pregnant with their second child which will be due in the summer 2023.  They have a 70-year-old son currently.  There is no known family history of Crohn's disease  He does not smoke.  Review of Systems As per HPI, otherwise negative  Current Medications, Allergies, Past Medical History, Past Surgical History, Family History and Social History were reviewed in [employer] record.    Objective:   Physical Exam Ht 5' 10"  (1.778 m)    Wt 219 lb 12.8 oz (99.7 kg)    BMI 31.54 kg/m  Gen: awake, alert, NAD HEENT: anicteric  Abd: soft, NT/ND, +BS throughout Ext: no c/c/e Neuro: nonfocal MR ABDOMEN AND PELVIS WITHOUT AND WITH CONTRAST (MR ENTEROGRAPHY)   TECHNIQUE: Multiplanar, multisequence MRI of the abdomen and pelvis was performed both before and during bolus administration of intravenous contrast. Negative oral contrast VoLumen was given.   CONTRAST:  60m GADAVIST GADOBUTROL 1 MMOL/ML IV SOLN   COMPARISON:  None.   FINDINGS: COMBINED FINDINGS FOR BOTH MR ABDOMEN AND PELVIS   Lower chest: Unremarkable where included   Hepatobiliary: Gallbladder surgically absent. No biliary dilatation. Hepatic parenchyma unremarkable where included.   Pancreas:  Unremarkable   Spleen:  Unremarkable   Adrenals/Urinary Tract:  Unremarkable   Stomach/Bowel: Diffuse abnormal wall  thickening and accentuated mucosal enhancement is present in the distal 25 cm of the ileum with associated fold effacement as shown for example on image 125 series 12 and image 95 series 12. No definite active colonic involvement is identified although please note that the anal region is obscured due to  boundary artifact.   Normal appearing fold pattern in most of the jejunum. There is at least 1 collapsed jejunal loop for example on image 105 of series 12 with some questionable accentuation of wall thickening which could represent low-grade Crohn's disease involvement in general I favor this is probably being simply a collapsed loop rather than active involvement.   Visualized stomach unremarkable.  The duodenum appears unremarkable.   No dilated bowel.  No additional significant bowel findings.   Vascular/Lymphatic: Scattered ileocolic lymph nodes as on image 21 series 4, likely reactive. No pathologic adenopathy.   Reproductive: Unremarkable where included.   Other:  No ascites.   Musculoskeletal: Mild disc desiccation at L5-S1.   IMPRESSION: 1. Active Crohn's disease involving a 25 cm segment of the terminal ileum extending to the ileocecal valve, with associated abnormal wall thickening, mucosal enhancement, fold effacement, and low-level edema in the mesentery with scattered regional reactive lymph nodes. 2. Equivocal wall thickening in a loop of jejunum, although more likely this is simply due to nondistention rather than active skip lesion involving the jejunum.     Electronically Signed   By: Van Clines M.D.   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    Ileal Crohn's disease with intermittent partial obstruction/perianal Crohn's disease with history of fistula --we spent time today discussing Crohn's disease at length including natural history and current  medical therapies.  His symptoms when he eats cruciferous or higher fiber vegetables are consistent with partial small bowel obstruction.  This is not required hospitalization though on one occasion nearly a year ago symptoms were very severe for several hours.  After complete discussion regarding therapy and expected outcome he is agreeable to proceeding with Humira and low-dose azathioprine.  We discussed therapy including immunomodulators and biologics.  We also discussed the risks in great detail including the risk of infection (including reactivation of latent TB and underlying viral hepatitis), hepatotoxicity, leukopenia, pancreatitis, nausea, malignancy (specifically lymphoma), demyelinating disease, rash and rarely heart failure.  Given his history of fistulizing disease anti-TNF therapy is recommended over other current options.  We discussed disease monitoring which can be done with either colonoscopy, MR enterography and possibly fecal calprotectin.  Have encouraged him to submit the fecal calprotectin prior to starting therapy so that we can use this as a surrogate assuming it is elevated prior to therapy.  We also discussed the importance of vaccination.  He normally gets annual flu vaccine though did not this year, has had the initial COVID-19 vaccination series.  I cautioned him against live virus vaccines such as MMR as these cannot be used in patients on Humira.  I also recommended Pneumovax of which there are now 3.  He does not wish to start these today but will likely start these in the future.  He has had  chickenpox and so the newest shingles vaccine is also recommended. --Humira initiation with standard induction followed by 40 mg every 2 weeks --Begin low-dose azathioprine 50 mg daily --CBC and CMP recommended in 2 weeks after azathioprine start --He would like to begin the nursing faster program with Humira and so please make this referral --Vaccination as above --Submit fecal  calprotectin  Follow-up with me in 2 to 3 months  40 minutes total spent today including patient facing time, coordination of care, reviewing medical history/procedures/pertinent radiology studies, and documentation of the encounter.

## 2021-04-23 NOTE — Patient Instructions (Addendum)
Your provider has requested that you go to the basement level for lab work on 05/07/21. Press "B" on the elevator. The lab is located at the first door on the left as you exit the elevator.  We will work on getting Humira approved through [employer].  We have sent the following medications to your pharmacy for you to pick up at your convenience: Azathioprine 50 mg daily  Please follow up with Dr Hilarie Fredrickson on 06/26/21 at 910 am.  If you are age 38 or older, your body mass index should be between 23-30. Your Body mass index is 31.54 kg/m. If this is out of the aforementioned range listed, please consider follow up with your Primary Care Provider.  If you are age 87 or younger, your body mass index should be between 19-25. Your Body mass index is 31.54 kg/m. If this is out of the aformentioned range listed, please consider follow up with your Primary Care Provider.   ________________________________________________________  The [place] [facility] providers would like to encourage you to use [facility] to communicate with providers for non-urgent requests or questions.  Due to long hold times on the telephone, sending your provider a message by [facility] may be a faster and more efficient way to get a response.  Please allow 48 business hours for a response.  Please remember that this is for non-urgent requests.  _______________________________________________________ Due to recent changes in healthcare laws, you may see the results of your imaging and laboratory studies on MyChart before your provider has had a chance to review them.  We understand that in some cases there may be results that are confusing or concerning to you. Not all laboratory results come back in the same time frame and the provider may be waiting for multiple results in order to interpret others.  Please give [place] 48 hours in order for your provider to thoroughly review all the results before contacting the office for clarification of your  results.

## 2021-04-30 ENCOUNTER — Other Ambulatory Visit: Payer: Self-pay

## 2021-04-30 ENCOUNTER — Telehealth: Payer: Self-pay | Admitting: Internal Medicine

## 2021-04-30 MED ORDER — HUMIRA-CD/UC/HS STARTER 40 MG/0.8ML ~~LOC~~ PNKT: PEN_INJECTOR | SUBCUTANEOUS | 0 refills | Status: DC

## 2021-04-30 MED ORDER — HUMIRA (2 PEN) 40 MG/0.4ML ~~LOC~~ AJKT: 40.0000 mg | AUTO-INJECTOR | SUBCUTANEOUS | 11 refills | Status: DC

## 2021-04-30 NOTE — Telephone Encounter (Signed)
Patient called and wanted to check up on Hemria. Stated that Dr. Hilarie Fredrickson wanted to start it as well as Imuran. Seeking advice, please advise.

## 2021-04-30 NOTE — Telephone Encounter (Signed)
Humira script sent to [facility] along with OV note and copy of insurance card for starter kit and maintenance dose.

## 2021-05-02 NOTE — Telephone Encounter (Signed)
Call placed to [facility] to check on Humira. They have the order but have not started processing it yet. Refaxed the records and have requested they expedite this prescription.

## 2021-05-03 ENCOUNTER — Other Ambulatory Visit: Payer: Self-pay

## 2021-05-03 MED ORDER — HUMIRA-CD/UC/HS STARTER 80 MG/0.8ML ~~LOC~~ AJKT: AUTO-INJECTOR | SUBCUTANEOUS | 0 refills | Status: DC

## 2021-05-08 ENCOUNTER — Telehealth: Payer: Self-pay | Admitting: Internal Medicine

## 2021-05-08 NOTE — Telephone Encounter (Signed)
Inbound call from patient, states that he is supposed to come in for labs but was not sure if he needed to come in for the labs before or after he starts Humira. Seeking advice, please advise.

## 2021-05-08 NOTE — Telephone Encounter (Signed)
Let pt know that he needs to have labs 2 weeks after the date he started the imuran. He also needs to complete the fecal calprotectin. Orders in epic.

## 2021-05-09 ENCOUNTER — Ambulatory Visit: Payer: Commercial Managed Care - PPO | Admitting: Internal Medicine

## 2021-05-18 ENCOUNTER — Telehealth: Payer: Self-pay | Admitting: Physician Assistant

## 2021-05-18 NOTE — Telephone Encounter (Signed)
Patient contacted answering service this morning with questions about initiating Humira.  He has a history of small bowel Crohn's disease.  At office visit in late January, Dr. Hilarie Fredrickson added azathioprine 50 mg daily which the patient has been taking for at least a couple of weeks without adverse effects.  In the mail he recently received Humira and was supposed to start this once he received it.  He was given a give himself the injection this morning but he has had some nausea, vomiting in last 24 hours and feels like he has a viral gastritis.  Was wondering if he should inject with the Humira or hold off on this. I advised him to wait to give himself the Humira until his stomach settles down.  Also advised him to call the GI office if the nausea and vomiting does not resolve given his history of Crohn's disease there is always risk of obstruction, complications of Crohn's disease. Patient agreed with plan. Next appointment with Dr. Hilarie Fredrickson is on 3/29.  Azucena Freed

## 2021-05-20 ENCOUNTER — Other Ambulatory Visit: Payer: Self-pay | Admitting: Internal Medicine

## 2021-05-20 NOTE — Telephone Encounter (Signed)
Patient needs to come for CBC and CMP given his new start of azathioprine.  We also should add on a lipase Please let me know if his GI symptoms of nausea, vomiting have resolved If so he can start Humira; I would have thought he is already started, but please confirm if he has taken the first dose. Thanks [employer]

## 2021-05-21 ENCOUNTER — Other Ambulatory Visit: Payer: Self-pay

## 2021-05-21 DIAGNOSIS — K50018 Crohn's disease of small intestine with other complication: Secondary | ICD-10-CM

## 2021-05-21 NOTE — Telephone Encounter (Signed)
Spoke with pt and he states he last vomited on Sat but then had diarrhea yesterday. He feels about 85-90% back to normal. His son had a stomach bug and he thinks he got it. He will come for lab on Thursday that will be 2 weeks and 1 day since starting the Imuran, pt did not start it when he was instructed to originally start it. He will wait a few days when he feels back to normal to start the Humira. Orders in for labs and Dr. Hilarie Fredrickson aware.

## 2021-05-23 ENCOUNTER — Other Ambulatory Visit: Payer: Self-pay

## 2021-05-23 ENCOUNTER — Other Ambulatory Visit (INDEPENDENT_AMBULATORY_CARE_PROVIDER_SITE_OTHER): Payer: BC Managed Care – PPO

## 2021-05-23 DIAGNOSIS — K50018 Crohn's disease of small intestine with other complication: Secondary | ICD-10-CM | POA: Diagnosis not present

## 2021-05-23 DIAGNOSIS — K50119 Crohn's disease of large intestine with unspecified complications: Secondary | ICD-10-CM

## 2021-05-23 LAB — COMPREHENSIVE METABOLIC PANEL
ALT: 25 U/L (ref 0–53)
AST: 23 U/L (ref 0–37)
Albumin: 4.2 g/dL (ref 3.5–5.2)
Alkaline Phosphatase: 58 U/L (ref 39–117)
BUN: 8 mg/dL (ref 6–23)
CO2: 31 mEq/L (ref 19–32)
Calcium: 9 mg/dL (ref 8.4–10.5)
Chloride: 103 mEq/L (ref 96–112)
Creatinine, Ser: 1 mg/dL (ref 0.40–1.50)
GFR: 96.03 mL/min (ref >60.00)
Glucose, Bld: 96 mg/dL (ref 70–99)
Potassium: 3.3 mEq/L — ABNORMAL LOW (ref 3.5–5.1)
Sodium: 140 mEq/L (ref 135–145)
Total Bilirubin: 0.6 mg/dL (ref 0.2–1.2)
Total Protein: 7 g/dL (ref 6.0–8.3)

## 2021-05-23 LAB — CBC WITH DIFFERENTIAL/PLATELET
Basophils Absolute: 0.1 10*3/uL (ref 0.0–0.1)
Basophils Relative: 0.5 % (ref 0.0–3.0)
Eosinophils Absolute: 0.2 10*3/uL (ref 0.0–0.7)
Eosinophils Relative: 2.2 % (ref 0.0–5.0)
HCT: 40.7 % (ref 39.0–52.0)
Hemoglobin: 14.5 g/dL (ref 13.0–17.0)
Lymphocytes Relative: 28.7 % (ref 12.0–46.0)
Lymphs Abs: 2.9 10*3/uL (ref 0.7–4.0)
MCHC: 35.7 g/dL (ref 30.0–36.0)
MCV: 84.9 fl (ref 78.0–100.0)
Monocytes Absolute: 1 10*3/uL (ref 0.1–1.0)
Monocytes Relative: 10.2 % (ref 3.0–12.0)
Neutro Abs: 5.9 10*3/uL (ref 1.4–7.7)
Neutrophils Relative %: 58.4 % (ref 43.0–77.0)
Platelets: 249 10*3/uL (ref 150.0–400.0)
RBC: 4.79 Mil/uL (ref 4.22–5.81)
RDW: 13.7 % (ref 11.5–15.5)
WBC: 10.2 10*3/uL (ref 4.0–10.5)

## 2021-05-23 LAB — LIPASE: Lipase: 74 U/L — ABNORMAL HIGH (ref 11.0–59.0)

## 2021-06-10 ENCOUNTER — Other Ambulatory Visit (INDEPENDENT_AMBULATORY_CARE_PROVIDER_SITE_OTHER): Payer: BC Managed Care – PPO

## 2021-06-10 DIAGNOSIS — K50018 Crohn's disease of small intestine with other complication: Secondary | ICD-10-CM | POA: Diagnosis not present

## 2021-06-10 DIAGNOSIS — K50019 Crohn's disease of small intestine with unspecified complications: Secondary | ICD-10-CM | POA: Diagnosis not present

## 2021-06-10 LAB — BASIC METABOLIC PANEL
BUN: 14 mg/dL (ref 6–23)
CO2: 27 mEq/L (ref 19–32)
Calcium: 9.6 mg/dL (ref 8.4–10.5)
Chloride: 104 mEq/L (ref 96–112)
Creatinine, Ser: 1.02 mg/dL (ref 0.40–1.50)
GFR: 93.74 mL/min (ref >60.00)
Glucose, Bld: 96 mg/dL (ref 70–99)
Potassium: 4.7 mEq/L (ref 3.5–5.1)
Sodium: 137 mEq/L (ref 135–145)

## 2021-06-10 LAB — LIPASE: Lipase: 54 U/L (ref 11.0–59.0)

## 2021-06-14 LAB — CALPROTECTIN, FECAL: Calprotectin, Fecal: 260 ug/g — ABNORMAL HIGH (ref 0–120)

## 2021-06-26 ENCOUNTER — Ambulatory Visit (INDEPENDENT_AMBULATORY_CARE_PROVIDER_SITE_OTHER): Payer: BC Managed Care – PPO | Admitting: Internal Medicine

## 2021-06-26 ENCOUNTER — Other Ambulatory Visit (INDEPENDENT_AMBULATORY_CARE_PROVIDER_SITE_OTHER): Payer: BC Managed Care – PPO

## 2021-06-26 ENCOUNTER — Encounter: Payer: Self-pay | Admitting: Internal Medicine

## 2021-06-26 VITALS — BP 110/76 | HR 76 | Ht 70.0 in | Wt 222.0 lb

## 2021-06-26 DIAGNOSIS — K50019 Crohn's disease of small intestine with unspecified complications: Secondary | ICD-10-CM

## 2021-06-26 DIAGNOSIS — K611 Rectal abscess: Secondary | ICD-10-CM | POA: Diagnosis not present

## 2021-06-26 DIAGNOSIS — K50119 Crohn's disease of large intestine with unspecified complications: Secondary | ICD-10-CM

## 2021-06-26 LAB — COMPREHENSIVE METABOLIC PANEL
ALT: 23 U/L (ref 0–53)
AST: 19 U/L (ref 0–37)
Albumin: 4.5 g/dL (ref 3.5–5.2)
Alkaline Phosphatase: 56 U/L (ref 39–117)
BUN: 12 mg/dL (ref 6–23)
CO2: 24 mEq/L (ref 19–32)
Calcium: 9.2 mg/dL (ref 8.4–10.5)
Chloride: 106 mEq/L (ref 96–112)
Creatinine, Ser: 0.96 mg/dL (ref 0.40–1.50)
GFR: 100.78 mL/min (ref >60.00)
Glucose, Bld: 94 mg/dL (ref 70–99)
Potassium: 4.1 mEq/L (ref 3.5–5.1)
Sodium: 138 mEq/L (ref 135–145)
Total Bilirubin: 0.7 mg/dL (ref 0.2–1.2)
Total Protein: 7.4 g/dL (ref 6.0–8.3)

## 2021-06-26 LAB — CBC
HCT: 43.3 % (ref 39.0–52.0)
Hemoglobin: 15.3 g/dL (ref 13.0–17.0)
MCHC: 35.4 g/dL (ref 30.0–36.0)
MCV: 87.6 fl (ref 78.0–100.0)
Platelets: 197 10*3/uL (ref 150.0–400.0)
RBC: 4.94 Mil/uL (ref 4.22–5.81)
RDW: 13.6 % (ref 11.5–15.5)
WBC: 7.5 10*3/uL (ref 4.0–10.5)

## 2021-06-26 LAB — LIPASE: Lipase: 49 U/L (ref 11.0–59.0)

## 2021-06-26 NOTE — Progress Notes (Signed)
? ?  Subjective:  ? ? Patient ID: Glen Cain, male    DOB: 12/29/1985, 36 y.o.   MRN: [id number] ? ?HPI ?Glen Cain is a 36 year old male with a history of ileal and perianal Crohn's disease (diagnosis December 2022; Humira initiation with low-dose azathioprine February 2023), history of perianal abscess and perianal fistula (fistulotomy September 2022) who is here for follow-up.  He is here today alone. ? ?He started Humira with standard induction and took his third dose, this most recent dose was the first 40 mg dose, on Sunday, 06/23/2021.  Tolerating this well.  Taking it in his thighs.  He reports no recent abdominal pain or partial obstructive symptoms.  No further nausea or vomiting.  No abdominal cramping.  Bowel movements have been more normal.  Bowel movements more round and less flat.  Occasional minor bleeding.  Is avoiding some foods like Brussels sprouts though he has recently eaten broccoli and grapes without pain. ? ?His wife is pregnant and his second child, a little girl, is due around 11/08/2021.  He has a 6-year-old son. ? ? ?Review of Systems ?As per HPI, otherwise negative ? ?Current Medications, Allergies, Past Medical History, Past Surgical History, Family History and Social History were reviewed in [employer] record. ?   ?Objective:  ? Physical Exam ?BP 110/76   Pulse 76   Ht 5' 10"  (1.778 m)   Wt 222 lb (100.7 kg)   BMI 31.85 kg/m?  ?Gen: awake, alert, NAD ?HEENT: anicteric ?Abd: soft, mild tenderness in the right lower quadrant without rebound or guarding, no palpable masses, nondistended, +BS throughout ?Ext: no c/c/e ?Neuro: nonfocal ? ?Fecal calprotectin 06/10/2021 --260 (of note this was only 2 weeks after starting Humira) ?   ?Assessment & Plan:  ?36 year old male with a history of ileal and perianal Crohn's disease (diagnosis December 2022; Humira initiation with low-dose azathioprine February 2023), history of perianal abscess and perianal fistula  (fistulotomy September 2022) who is here for follow-up.   ? ?Ileal Crohn's disease with intermittent partial obstruction/perianal Crohn's disease with history of fistula --he has started low-dose azathioprine 50 mg daily as well as Humira.  He had a bump in his lipase along with some upper abdominal pain but we decided that this may have been secondary to viral gastroenteritis which his family has been dealing with.  We repeated lipase and it normalized.  He has been tolerating azathioprine 50 mg daily without abdominal pain, nausea or vomiting.  He is tolerating Humira well.  He is now 4 weeks into therapy. ?--Plan to continue Humira 40 mg every 2 weeks ?--Continue azathioprine 50 mg daily ?--Repeat CBC, CMP and lipase today ?--Repeat fecal calprotectin at next visit ?--We discussed the goal of management in his IBD; we will try to objectively and subjectively measure disease response going forward.  Hopefully we can follow fecal calprotectin as a surrogate.  We will also consider repeat MRI versus colonoscopy perhaps later this year to assess response to Humira and low-dose azathioprine.  The goal is clinical response, endoscopic response, endoscopic remission, and histologic remission in that order.  We discussed this today. ? ?Follow-up with me in June or July of this year, sooner if needed ? ?30 minutes total spent today including patient facing time, coordination of care, reviewing medical history/procedures/pertinent radiology studies, and documentation of the encounter. ? ? ?

## 2021-06-26 NOTE — Patient Instructions (Signed)
Your provider has requested that you go to the basement level for lab work before leaving today. Press "B" on the elevator. The lab is located at the first door on the left as you exit the elevator. ? ?Continue Humira and Azathioprine as directed.  ? ?Please keep follow up on 09/04/21 at 8:30am with Dr Hilarie Fredrickson ? ?Thank you for choosing me and [facility]. ? ?Dr. Hilarie Fredrickson  ? ?

## 2021-08-06 ENCOUNTER — Encounter: Payer: Self-pay | Admitting: Internal Medicine

## 2021-08-19 ENCOUNTER — Other Ambulatory Visit: Payer: Self-pay | Admitting: Internal Medicine

## 2021-08-20 ENCOUNTER — Encounter: Payer: Self-pay | Admitting: Family Medicine

## 2021-08-20 ENCOUNTER — Ambulatory Visit (INDEPENDENT_AMBULATORY_CARE_PROVIDER_SITE_OTHER): Payer: BC Managed Care – PPO | Admitting: Family Medicine

## 2021-08-20 VITALS — BP 110/80 | HR 70 | Temp 97.9°F | Ht 70.47 in | Wt 224.1 lb

## 2021-08-20 DIAGNOSIS — Z0001 Encounter for general adult medical examination with abnormal findings: Secondary | ICD-10-CM

## 2021-08-20 DIAGNOSIS — D229 Melanocytic nevi, unspecified: Secondary | ICD-10-CM | POA: Diagnosis not present

## 2021-08-20 DIAGNOSIS — Z0000 Encounter for general adult medical examination without abnormal findings: Secondary | ICD-10-CM

## 2021-08-20 LAB — CBC WITH DIFFERENTIAL/PLATELET
Basophils Absolute: 0 10*3/uL (ref 0.0–0.1)
Basophils Relative: 0.4 % (ref 0.0–3.0)
Eosinophils Absolute: 0.1 10*3/uL (ref 0.0–0.7)
Eosinophils Relative: 2.1 % (ref 0.0–5.0)
HCT: 43.3 % (ref 39.0–52.0)
Hemoglobin: 15.3 g/dL (ref 13.0–17.0)
Lymphocytes Relative: 40.7 % (ref 12.0–46.0)
Lymphs Abs: 2.5 10*3/uL (ref 0.7–4.0)
MCHC: 35.2 g/dL (ref 30.0–36.0)
MCV: 88.8 fl (ref 78.0–100.0)
Monocytes Absolute: 0.6 10*3/uL (ref 0.1–1.0)
Monocytes Relative: 9 % (ref 3.0–12.0)
Neutro Abs: 3 10*3/uL (ref 1.4–7.7)
Neutrophils Relative %: 47.8 % (ref 43.0–77.0)
Platelets: 213 10*3/uL (ref 150.0–400.0)
RBC: 4.88 Mil/uL (ref 4.22–5.81)
RDW: 13.9 % (ref 11.5–15.5)
WBC: 6.2 10*3/uL (ref 4.0–10.5)

## 2021-08-20 LAB — HEPATIC FUNCTION PANEL
ALT: 20 U/L (ref 0–53)
AST: 20 U/L (ref 0–37)
Albumin: 4.4 g/dL (ref 3.5–5.2)
Alkaline Phosphatase: 52 U/L (ref 39–117)
Bilirubin, Direct: 0.2 mg/dL (ref 0.0–0.3)
Total Bilirubin: 0.8 mg/dL (ref 0.2–1.2)
Total Protein: 7.1 g/dL (ref 6.0–8.3)

## 2021-08-20 LAB — BASIC METABOLIC PANEL
BUN: 15 mg/dL (ref 6–23)
CO2: 27 mEq/L (ref 19–32)
Calcium: 9.4 mg/dL (ref 8.4–10.5)
Chloride: 106 mEq/L (ref 96–112)
Creatinine, Ser: 0.99 mg/dL (ref 0.40–1.50)
GFR: 97.03 mL/min (ref >60.00)
Glucose, Bld: 95 mg/dL (ref 70–99)
Potassium: 4.2 mEq/L (ref 3.5–5.1)
Sodium: 140 mEq/L (ref 135–145)

## 2021-08-20 LAB — LIPID PANEL
Cholesterol: 127 mg/dL (ref 0–200)
HDL: 36.5 mg/dL — ABNORMAL LOW (ref >39.00)
LDL Cholesterol: 72 mg/dL (ref 0–99)
NonHDL: 90.79
Total CHOL/HDL Ratio: 3
Triglycerides: 95 mg/dL (ref 0.0–149.0)
VLDL: 19 mg/dL (ref 0.0–40.0)

## 2021-08-20 LAB — PSA: PSA: 0.7 ng/mL (ref 0.10–4.00)

## 2021-08-20 LAB — TSH: TSH: 1.55 u[IU]/mL (ref 0.35–5.50)

## 2021-08-20 NOTE — Progress Notes (Signed)
Established Patient Office Visit  Subjective   Patient ID: Glen Cain, male    DOB: 1985/07/02  Age: 36 y.o. MRN: [id number]  Chief Complaint  Patient presents with   Annual Exam    HPI   Glen Cain is here for physical exam.  He was here last year with perianal abscess which was recurrent and eventually diagnosed with Crohn's disease.  He is currently on Imuran and Humira.  Followed closely by GI.  Had recent labs with CBC and CMP which were normal.  Generally feels well.  Has had some mild weight gain this year.  He and his wife are expecting their second child in August.  They currently have a 18-year-old son.  Health maintenance reviewed  -Tetanus up-to-date. -Previous hepatitis C screen negative -He had colonoscopy last year  Family history-father with history of hypertension and prostate cancer.  No family history of type 2 diabetes or premature CAD  Social history-he is married.  36-year-old son with daughter on the way in August.  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 [place]  Past Medical History:  Diagnosis Date   Anal fissure    Benign neoplasm of skin, site unspecified 08/22/2009   Crohn's disease ([place])    Internal hemorrhoids    Peri-rectal abscess    Past Surgical History:  Procedure Laterality Date   ARTHROSCOPIC REPAIR ACL Right 03/31/2005   CHOLECYSTECTOMY  02/28/2014   EVALUATION UNDER ANESTHESIA WITH HEMORRHOIDECTOMY N/A 08/16/2020   Procedure: ANORECTAL EXAM UNDER ANESTHESIA, anterior superficial fistulotomy with marsupialization, excision of posterior anal sinus tract, excision of anal tag;  Surgeon: Michael Boston, MD;  Location: [facility];  Service: General;  Laterality: N/A;   KNEE ARTHROSCOPY Right 06/16/2013   Procedure: RIGHT ARTHROSCOPY KNEE MENISECTOMY WITH DEBRIDEMENT ;  Surgeon: Marin Shutter, MD;  Location: [place];  Service: Orthopedics;  Laterality: Right;   rectal fissure     With abscess   WISDOM TOOTH EXTRACTION       reports that he quit smoking about 12 years ago. His smoking use included cigarettes. He has quit using smokeless tobacco. He reports current alcohol use. He reports that he does not use drugs. family history includes Cancer in an other family member; Diabetes in an other family member; Hypertension in his father and another family member; Stroke in an other family member. No Known Allergies  Review of Systems  Constitutional:  Negative for chills, fever, malaise/fatigue and weight loss.  HENT:  Negative for hearing loss.   Eyes:  Negative for blurred vision and double vision.  Respiratory:  Negative for cough and shortness of breath.   Cardiovascular:  Negative for chest pain, palpitations and leg swelling.  Gastrointestinal:  Negative for abdominal pain, blood in stool, constipation, diarrhea, nausea and vomiting.  Genitourinary:  Negative for dysuria.  Skin:  Negative for rash.  Neurological:  Negative for dizziness, speech change, seizures, loss of consciousness and headaches.  Psychiatric/Behavioral:  Negative for depression.      Objective:     BP 110/80 (BP Location: Left Arm, Patient Position: Sitting, Cuff Size: Normal)   Pulse 70   Temp 97.9 F (36.6 C)   Ht 5' 10.47" (1.79 m)   Wt 224 lb 1.6 oz (101.7 kg)   SpO2 98%   BMI 31.73 kg/m    Physical Exam Constitutional:      General: He is not in acute distress.    Appearance: He is well-developed.  HENT:  Head: Normocephalic and atraumatic.     Right Ear: External ear normal.     Left Ear: External ear normal.  Eyes:     Conjunctiva/sclera: Conjunctivae normal.     Pupils: Pupils are equal, round, and reactive to light.  Neck:     Thyroid: No thyromegaly.  Cardiovascular:     Rate and Rhythm: Normal rate and regular rhythm.     Heart sounds: Normal heart sounds. No murmur heard. Pulmonary:     Effort: No respiratory distress.     Breath sounds: No wheezing or rales.  Abdominal:     General: Bowel sounds  are normal. There is no distension.     Palpations: Abdomen is soft. There is no mass.     Tenderness: There is no abdominal tenderness. There is no guarding or rebound.  Musculoskeletal:     Cervical back: Normal range of motion and neck supple.  Lymphadenopathy:     Cervical: No cervical adenopathy.  Skin:    Findings: No rash.     Comments: Multiple nevi on his trunk.  Neurological:     Mental Status: He is alert and oriented to person, place, and time.     Cranial Nerves: No cranial nerve deficit.     No results found for any visits on 08/20/21.    The ASCVD Risk score (Arnett DK, et al., 2019) failed to calculate for the following reasons:   The 2019 ASCVD risk score is only valid for ages 94 to 109    Assessment & Plan:   Problem List Items Addressed This Visit   None Visit Diagnoses     Physical exam    -  Primary   Relevant Orders   Basic metabolic panel   Lipid panel   CBC with Differential/Platelet   TSH   Hepatic function panel   PSA   Multiple nevi       Relevant Orders   Ambulatory referral to Dermatology     -Obtain labs as above.  Patient requesting PSA with positive family history of prostate cancer.  We stated we normally start at 40 for baseline screening but he would like to proceed nevertheless. -Recommend annual flu vaccine -Set up dermatology referral for screening  No follow-ups on file.    Carolann Littler, MD

## 2021-08-27 ENCOUNTER — Encounter: Payer: Self-pay | Admitting: Internal Medicine

## 2021-08-29 ENCOUNTER — Other Ambulatory Visit: Payer: Self-pay

## 2021-08-29 DIAGNOSIS — K50119 Crohn's disease of large intestine with unspecified complications: Secondary | ICD-10-CM

## 2021-08-29 NOTE — Telephone Encounter (Signed)
Fecal calprotectin can be ordered now and he can come pick it up at his earliest convenience Please link this to Crohn's disease diagnosis

## 2021-09-04 ENCOUNTER — Other Ambulatory Visit: Payer: BC Managed Care – PPO

## 2021-09-04 ENCOUNTER — Encounter: Payer: Self-pay | Admitting: Internal Medicine

## 2021-09-04 ENCOUNTER — Ambulatory Visit (INDEPENDENT_AMBULATORY_CARE_PROVIDER_SITE_OTHER): Payer: BC Managed Care – PPO | Admitting: Internal Medicine

## 2021-09-04 VITALS — BP 118/82 | HR 72 | Ht 70.0 in | Wt 221.8 lb

## 2021-09-04 DIAGNOSIS — K50119 Crohn's disease of large intestine with unspecified complications: Secondary | ICD-10-CM

## 2021-09-04 DIAGNOSIS — K50018 Crohn's disease of small intestine with other complication: Secondary | ICD-10-CM

## 2021-09-04 NOTE — Progress Notes (Signed)
Subjective:    Patient ID: Glen Cain, male    DOB: 06/15/1985, 36 y.o.   MRN: [id number]  HPI Glen Cain is a 36 year old male with a history of ileal and perianal Crohn's disease (diagnosis December 2022, Humira and low-dose azathioprine initiated February 2023), history of perianal abscess and fistula (fistulotomy September 2022) who is here for follow-up.  He is here alone today and I last saw him on 06/26/2021.  He reports that overall he is doing fairly well.  He sees occasionally some mucus in the stool and has done so in the last several days.  No bleeding.  No obstructive symptoms, nausea or vomiting.  Earlier this week at work he had an episode of right-sided abdominal pain but it was short-lived and has not recurred.  He shortly thereafter had a bowel movement so he wonders if it was pain related to needing to have a bowel movement.  Last dose of Humira was 09/01/2021.  He has noticed occasional left eye twitching and a strange sensation in his right face lateral to his mouth.  He is felt eye twitching before and wonders if it is stress related.  He has noticed a fine rash just under his hairline on the top of his forehead.  He has been referred to dermatology which she will see in August.  His wife's pregnancy continues to progress well and she is due August 2023.   Review of Systems As per HPI, otherwise negative  Current Medications, Allergies, Past Medical History, Past Surgical History, Family History and Social History were reviewed in [employer] record.    Objective:   Physical Exam BP 118/82   Pulse 72   Ht 5' 10"  (1.778 m)   Wt 221 lb 12.8 oz (100.6 kg)   SpO2 98%   BMI 31.82 kg/m  Gen: awake, alert, NAD HEENT: anicteric  Abd: soft, NT/ND, +BS throughout Ext: no c/c/e Neuro: nonfocal  CMP     Component Value Date/Time   NA 140 08/20/2021 0838   K 4.2 08/20/2021 0838   CL 106 08/20/2021 0838   CO2 27 08/20/2021 0838   GLUCOSE 95  08/20/2021 0838   BUN 15 08/20/2021 0838   CREATININE 0.99 08/20/2021 0838   CALCIUM 9.4 08/20/2021 0838   PROT 7.1 08/20/2021 0838   ALBUMIN 4.4 08/20/2021 0838   AST 20 08/20/2021 0838   ALT 20 08/20/2021 0838   ALKPHOS 52 08/20/2021 0838   BILITOT 0.8 08/20/2021 0838   GFRNONAA >90 06/14/2013 0828   GFRAA >90 06/14/2013 0828   CBC    Component Value Date/Time   WBC 6.2 08/20/2021 0838   RBC 4.88 08/20/2021 0838   HGB 15.3 08/20/2021 0838   HCT 43.3 08/20/2021 0838   PLT 213.0 08/20/2021 0838   MCV 88.8 08/20/2021 0838   MCH 29.7 08/10/2020 0936   MCHC 35.2 08/20/2021 0838   RDW 13.9 08/20/2021 0838   LYMPHSABS 2.5 08/20/2021 0838   MONOABS 0.6 08/20/2021 0838   EOSABS 0.1 08/20/2021 0838   BASOSABS 0.0 08/20/2021 0838   Lipase     Component Value Date/Time   LIPASE 49.0 06/26/2021 0947   Fecal calprotectin 260 March 2023      Assessment & Plan:  36 year old male with a history of ileal and perianal Crohn's disease (diagnosis December 2022, Humira and low-dose azathioprine initiated February 2023), history of perianal abscess and fistula (fistulotomy September 2022) who is here for follow-up.   Ileal Crohn's disease with  previous partial obstruction symptoms/perianal Crohn's with history of fistula --he seems to be doing well for the most part though he has some occasional mucus in stool and right-sided abdominal pain.  We discussed objective assessment of disease activity now that he has been on Humira for nearly 4 months. --Continue Humira 40 mg every 2 weeks --Continue azathioprine 50 mg daily --Proceed with fecal calprotectin at this time; if elevated then I would recommend repeat colonoscopy versus CT enterography; if persistent disease activity despite Humira and low-dose azathioprine I would recommend checking Humira trough and if adequate then consider changing to [employer].  2.  Fine rash --he will see dermatology in couple months.  Also skin check indicated  and patient with IBD on Humira.  I like to see him in September timeframe, sooner if recurrent flare of his Crohn's  30 minutes total spent today including patient facing time, coordination of care, reviewing medical history/procedures/pertinent radiology studies, and documentation of the encounter.

## 2021-09-04 NOTE — Patient Instructions (Signed)
If you are age 38 or older, your body mass index should be between 23-30. Your Body mass index is 31.82 kg/m. If this is out of the aforementioned range listed, please consider follow up with your Primary Care Provider.  If you are age 4 or younger, your body mass index should be between 19-25. Your Body mass index is 31.82 kg/m. If this is out of the aformentioned range listed, please consider follow up with your Primary Care Provider.   ________________________________________________________  The [facility] providers would like to encourage you to use [facility] to communicate with providers for non-urgent requests or questions.  Due to long hold times on the telephone, sending your provider a message by [facility] may be a faster and more efficient way to get a response.  Please allow 48 business hours for a response.  Please remember that this is for non-urgent requests.  _______________________________________________________  Make sure to complete the fecal calprotectin test.  Continue Humira and Azothiaprine  Please follow up in September

## 2021-09-29 ENCOUNTER — Encounter: Payer: Self-pay | Admitting: Internal Medicine

## 2021-10-30 ENCOUNTER — Other Ambulatory Visit: Payer: BC Managed Care – PPO

## 2021-10-30 DIAGNOSIS — K50119 Crohn's disease of large intestine with unspecified complications: Secondary | ICD-10-CM | POA: Diagnosis not present

## 2021-11-01 LAB — CALPROTECTIN, FECAL: Calprotectin, Fecal: 34 ug/g (ref 0–120)

## 2021-11-06 ENCOUNTER — Other Ambulatory Visit (HOSPITAL_COMMUNITY): Payer: Self-pay

## 2021-11-06 ENCOUNTER — Telehealth: Payer: Self-pay | Admitting: Pharmacy Technician

## 2021-11-06 NOTE — Telephone Encounter (Signed)
Patient Advocate Encounter  Prior Authorization for humira 54m has been approved.    PA# [id number] dates: 8.9.23 through 8.9.24  Navayah Sok B. CPhT P: [phone]: [phone]

## 2021-11-06 NOTE — Telephone Encounter (Signed)
Patient Advocate Encounter  Received notification from [place] that prior authorization for HUMIRA 40MG is required.   PA submitted on 8.9.23 Key [id number] Status is pending    Luciano Cutter, CPhT Patient Advocate Phone: [phone]

## 2021-11-14 ENCOUNTER — Other Ambulatory Visit: Payer: Self-pay | Admitting: Internal Medicine

## 2021-11-28 ENCOUNTER — Encounter: Payer: Self-pay | Admitting: Internal Medicine

## 2021-11-28 NOTE — Telephone Encounter (Signed)
Given his disease is predominantly small bowel including jejunal which would be out of the reach of the colonoscope I would recommend we repeat MR enterography in early October Continue current medication Fecal calprotectin has come down significantly which is excellent news and for this reason I do not recommend colonoscopy He can be placed on wait list for appointment earlier than November in the event someone cancels

## 2022-01-01 ENCOUNTER — Other Ambulatory Visit: Payer: Self-pay

## 2022-01-01 DIAGNOSIS — K50119 Crohn's disease of large intestine with unspecified complications: Secondary | ICD-10-CM

## 2022-01-01 DIAGNOSIS — K50018 Crohn's disease of small intestine with other complication: Secondary | ICD-10-CM

## 2022-01-01 DIAGNOSIS — K50019 Crohn's disease of small intestine with unspecified complications: Secondary | ICD-10-CM

## 2022-01-21 ENCOUNTER — Ambulatory Visit (HOSPITAL_COMMUNITY): Payer: BC Managed Care – PPO

## 2022-02-03 ENCOUNTER — Ambulatory Visit (HOSPITAL_COMMUNITY)
Admission: RE | Admit: 2022-02-03 | Discharge: 2022-02-03 | Disposition: A | Discharge status: Home / Self Care | Payer: BC Managed Care – PPO | Source: Ambulatory Visit | Attending: Internal Medicine | Admitting: Internal Medicine

## 2022-02-03 DIAGNOSIS — K50019 Crohn's disease of small intestine with unspecified complications: Secondary | ICD-10-CM | POA: Diagnosis not present

## 2022-02-03 DIAGNOSIS — K50119 Crohn's disease of large intestine with unspecified complications: Secondary | ICD-10-CM | POA: Diagnosis not present

## 2022-02-03 DIAGNOSIS — K50018 Crohn's disease of small intestine with other complication: Secondary | ICD-10-CM | POA: Diagnosis not present

## 2022-02-03 DIAGNOSIS — R161 Splenomegaly, not elsewhere classified: Secondary | ICD-10-CM | POA: Diagnosis not present

## 2022-02-03 DIAGNOSIS — K6389 Other specified diseases of intestine: Secondary | ICD-10-CM | POA: Diagnosis not present

## 2022-02-03 DIAGNOSIS — K5090 Crohn's disease, unspecified, without complications: Secondary | ICD-10-CM | POA: Diagnosis not present

## 2022-02-03 MED ORDER — GADOBUTROL 1 MMOL/ML IV SOLN
10.0000 mL | Freq: Once | INTRAVENOUS | Status: AC | PRN
  Administered 2022-02-03: 10 mL via INTRAVENOUS

## 2022-02-11 DIAGNOSIS — D225 Melanocytic nevi of trunk: Secondary | ICD-10-CM | POA: Diagnosis not present

## 2022-02-11 DIAGNOSIS — D1721 Benign lipomatous neoplasm of skin and subcutaneous tissue of right arm: Secondary | ICD-10-CM | POA: Diagnosis not present

## 2022-02-11 DIAGNOSIS — L814 Other melanin hyperpigmentation: Secondary | ICD-10-CM | POA: Diagnosis not present

## 2022-02-11 DIAGNOSIS — L821 Other seborrheic keratosis: Secondary | ICD-10-CM | POA: Diagnosis not present

## 2022-02-14 ENCOUNTER — Other Ambulatory Visit: Payer: Self-pay | Admitting: Internal Medicine

## 2022-02-24 ENCOUNTER — Other Ambulatory Visit (INDEPENDENT_AMBULATORY_CARE_PROVIDER_SITE_OTHER): Payer: BC Managed Care – PPO

## 2022-02-24 ENCOUNTER — Ambulatory Visit (INDEPENDENT_AMBULATORY_CARE_PROVIDER_SITE_OTHER): Payer: BC Managed Care – PPO | Admitting: Internal Medicine

## 2022-02-24 ENCOUNTER — Encounter: Payer: Self-pay | Admitting: Internal Medicine

## 2022-02-24 VITALS — BP 128/74 | HR 81 | Ht 70.0 in | Wt 227.0 lb

## 2022-02-24 DIAGNOSIS — R519 Headache, unspecified: Secondary | ICD-10-CM

## 2022-02-24 DIAGNOSIS — K50019 Crohn's disease of small intestine with unspecified complications: Secondary | ICD-10-CM

## 2022-02-24 DIAGNOSIS — K50119 Crohn's disease of large intestine with unspecified complications: Secondary | ICD-10-CM | POA: Diagnosis not present

## 2022-02-24 LAB — CBC WITH DIFFERENTIAL/PLATELET
Basophils Absolute: 0.1 10*3/uL (ref 0.0–0.1)
Basophils Relative: 0.9 % (ref 0.0–3.0)
Eosinophils Absolute: 0.2 10*3/uL (ref 0.0–0.7)
Eosinophils Relative: 2.8 % (ref 0.0–5.0)
HCT: 43.1 % (ref 39.0–52.0)
Hemoglobin: 15.2 g/dL (ref 13.0–17.0)
Lymphocytes Relative: 31 % (ref 12.0–46.0)
Lymphs Abs: 2.6 10*3/uL (ref 0.7–4.0)
MCHC: 35.2 g/dL (ref 30.0–36.0)
MCV: 90 fl (ref 78.0–100.0)
Monocytes Absolute: 0.7 10*3/uL (ref 0.1–1.0)
Monocytes Relative: 8.7 % (ref 3.0–12.0)
Neutro Abs: 4.8 10*3/uL (ref 1.4–7.7)
Neutrophils Relative %: 56.6 % (ref 43.0–77.0)
Platelets: 211 10*3/uL (ref 150.0–400.0)
RBC: 4.79 Mil/uL (ref 4.22–5.81)
RDW: 13.2 % (ref 11.5–15.5)
WBC: 8.5 10*3/uL (ref 4.0–10.5)

## 2022-02-24 LAB — COMPREHENSIVE METABOLIC PANEL
ALT: 20 U/L (ref 0–53)
AST: 19 U/L (ref 0–37)
Albumin: 4.6 g/dL (ref 3.5–5.2)
Alkaline Phosphatase: 57 U/L (ref 39–117)
BUN: 19 mg/dL (ref 6–23)
CO2: 26 mEq/L (ref 19–32)
Calcium: 9.3 mg/dL (ref 8.4–10.5)
Chloride: 100 mEq/L (ref 96–112)
Creatinine, Ser: 0.95 mg/dL (ref 0.40–1.50)
GFR: 101.58 mL/min (ref >60.00)
Glucose, Bld: 87 mg/dL (ref 70–99)
Potassium: 3.9 mEq/L (ref 3.5–5.1)
Sodium: 136 mEq/L (ref 135–145)
Total Bilirubin: 0.6 mg/dL (ref 0.2–1.2)
Total Protein: 7.7 g/dL (ref 6.0–8.3)

## 2022-02-24 NOTE — Progress Notes (Signed)
Subjective:    Patient ID: Glen Cain, male    DOB: 1985/04/16, 36 y.o.   MRN: [id number]  HPI Glen Cain is a 37 year old male with a history of ileal and perianal Crohn's disease (diagnosis December 2022, Humira and low-dose azathioprine initiated February 2023), history of perianal abscess and fistula (fistulotomy September 2022) who is here for follow-up.  He is here alone today and was last here on 09/04/2021.  He has been consistent with Humira 40 mg every 2 weeks, he doses on Sunday with last dose being 02/09/2022.  He also has been adherent with azathioprine having missed only several doses in the last multiple months.  He is feeling well though he has noticed more frequent headache.  He wonders if this is related to his Crohn's therapy though he does report increased stress and poor sleep.  His wife delivered their second child, a girl, 3 months ago.  They also have a 56-1/2-year-old.  He averages 1 headache per week but most recently 2 to 3 days/week.  This is mostly relieved by Tylenol.  He has avoided NSAIDs.  No abdominal pain.  No perianal pain, drainage or bleeding.  Stool is slightly looser than it was several months ago but still only 1 or 2 times per day.  He has increased dietary fiber and salads.  He does admit to overall frequent snacking and he is considering going on a diet as he wishes to lose weight.  He has gained about 6 pounds since his last office visit.  Energy levels are good.  No nausea or vomiting.  No rashes.   Review of Systems As per HPI, otherwise negative  Current Medications, Allergies, Past Medical History, Past Surgical History, Family History and Social History were reviewed in [employer] record.    Objective:   Physical Exam BP 128/74   Pulse 81   Ht 5' 10"  (1.778 m)   Wt 227 lb (103 kg)   SpO2 98%   BMI 32.57 kg/m  Gen: awake, alert, NAD HEENT: anicteric Abd: soft, NT/ND, +BS throughout Ext: no c/c/e Neuro:  nonfocal   Fecal cal 260, now 34   MR ABDOMEN AND PELVIS WITHOUT AND WITH CONTRAST (MR ENTEROGRAPHY)   TECHNIQUE: Multiplanar, multisequence MRI of the abdomen and pelvis was performed both before and during bolus administration of intravenous contrast. Negative oral contrast VoLumen was given.   CONTRAST:  42m GADAVIST GADOBUTROL 1 MMOL/ML IV SOLN   COMPARISON:  04/10/2021   FINDINGS: COMBINED FINDINGS FOR BOTH MR ABDOMEN AND PELVIS   Lower chest: No acute abnormality.   Hepatobiliary: No focal liver abnormality is seen. Status post cholecystectomy. No biliary dilatation.   Pancreas: Unremarkable. No pancreatic ductal dilatation or surrounding inflammatory changes.   Spleen: Mild splenomegaly, maximum coronal span 13.3 cm.   Adrenals/Urinary Tract: Adrenal glands are unremarkable. Kidneys are normal, without renal calculi, solid lesion, or hydronephrosis. Bladder is unremarkable.   Stomach/Bowel: Stomach is within normal limits. Appendix appears normal. Small bowel is moderately fluid distended. Improved wall thickening and mucosal hyperenhancement of the terminal ileum. On today's examination, there is a short, residual segment of terminal ileum, no greater than 5 cm in length with associated hyperenhancement and wall thickening (series 16, image 59). The remaining distal ileum demonstrates minimal, if any residual hyperenhancement, and there is no evidence of fistula, abscess, or obstruction. Previously queried segments of jejunum on prior examination are normal. No other inflammatory findings of the bowel. Note that the low  rectum is excluded on this examination.   Vascular/Lymphatic: No significant vascular findings are present. No enlarged abdominal or pelvic lymph nodes. Specifically, no residual enlarged mesenteric lymph nodes.   Reproductive: No mass or other significant abnormality.   Other: No abdominal wall hernia or abnormality. No ascites.    Musculoskeletal: No acute or significant osseous findings.   IMPRESSION: 1. Improved wall thickening and mucosal hyperenhancement of the terminal ileum. On today's examination, there is a short, residual segment of terminal ileum, no greater than 5 cm in length with associated hyperenhancement and wall thickening. The remaining distal ileum demonstrates minimal, if any residual hyperenhancement, and there is no evidence of fistula, abscess, or obstruction. Findings are consistent with improved Crohn's ileitis. 2. Previously queried segments of jejunum on prior examination are normal. 3. Note that the low rectum is excluded on this examination. 4. Status post cholecystectomy. 5. Mild splenomegaly.     Electronically Signed   By: Delanna Ahmadi M.D.   On: 02/05/2022 13:05       Assessment & Plan:  36 year old male with a history of ileal and perianal Crohn's disease (diagnosis December 2022, Humira and low-dose azathioprine initiated February 2023), history of perianal abscess and fistula (fistulotomy September 2022) who is here for follow-up.  Ileal Crohn's disease with prior partial obstruction/perianal Crohn's disease with history of fistula --clinically he is doing significantly better from a Crohn's perspective and this is also supported by objective evidence with normalization and fecal calprotectin and significant improvement by MR enterography.  We discussed these things today.  Difficult to know if Humira is contributing to headaches as there are other factors that could contribute.  We will monitor this going forward, check a Humira trough and if symptoms worsen or fail to to resolve we may consider changing therapy.  We are not changing therapy given overall significant improvement in Crohn's disease at this time. -- Continue Humira 40 mg every 2 weeks -- Continue azathioprine 50 mg daily -- CBC, CMP and quantiferon gold today -- Come back in the next 1 or 2 doses on a Friday  for a Humira trough level; I explained that this should be drawn on the Friday before he would dose on Sunday  2.  Headaches --unclear if this is a Humira or azathioprine side effect.  Monitor for now  3.  Weight loss --we discussed dietary options including weight watchers and other calorie restriction diets.  I recommended a diet low in processed foods and high in fruits, vegetables and lean meat.  Follow-up with me in 3 months, consider repeat fecal calprotectin at that time  30 minutes total spent today including patient facing time, coordination of care, reviewing medical history/procedures/pertinent radiology studies, and documentation of the encounter.

## 2022-02-24 NOTE — Patient Instructions (Signed)
_______________________________________________________  If you are age 38 or older, your body mass index should be between 23-30. Your Body mass index is 32.57 kg/m. If this is out of the aforementioned range listed, please consider follow up with your Primary Care Provider.  If you are age 9 or younger, your body mass index should be between 19-25. Your Body mass index is 32.57 kg/m. If this is out of the aformentioned range listed, please consider follow up with your Primary Care Provider.   ________________________________________________________  The [place] [facility] providers would like to encourage you to use [facility] to communicate with providers for non-urgent requests or questions.  Due to long hold times on the telephone, sending your provider a message by [facility] may be a faster and more efficient way to get a response.  Please allow 48 business hours for a response.  Please remember that this is for non-urgent requests.  _______________________________________________________  Your provider has requested that you go to the basement level for lab work before leaving today. Press "B" on the elevator. The lab is located at the first door on the left as you exit the elevator.  Stay on Humira and Azothiaprine at current dose.  Please follow up with Dr. Hilarie Fredrickson in 3 months

## 2022-02-24 NOTE — Addendum Note (Signed)
Addended by: Larina Bras on: 02/24/2022 03:55 PM   Modules accepted: Orders

## 2022-02-28 LAB — SPECIMEN STATUS REPORT

## 2022-02-28 LAB — QUANTIFERON-TB GOLD PLUS
QuantiFERON Mitogen Value: >10 IU/mL
QuantiFERON Nil Value: 0.16 IU/mL
QuantiFERON TB1 Ag Value: 0.15 IU/mL
QuantiFERON TB2 Ag Value: 0.15 IU/mL
QuantiFERON-TB Gold Plus: NEGATIVE

## 2022-03-13 ENCOUNTER — Other Ambulatory Visit: Payer: BC Managed Care – PPO

## 2022-03-13 DIAGNOSIS — R197 Diarrhea, unspecified: Secondary | ICD-10-CM | POA: Diagnosis not present

## 2022-03-13 DIAGNOSIS — Z79899 Other long term (current) drug therapy: Secondary | ICD-10-CM | POA: Diagnosis not present

## 2022-03-13 DIAGNOSIS — R519 Headache, unspecified: Secondary | ICD-10-CM

## 2022-03-13 DIAGNOSIS — R1031 Right lower quadrant pain: Secondary | ICD-10-CM | POA: Diagnosis not present

## 2022-03-13 DIAGNOSIS — K50019 Crohn's disease of small intestine with unspecified complications: Secondary | ICD-10-CM

## 2022-03-13 DIAGNOSIS — K50119 Crohn's disease of large intestine with unspecified complications: Secondary | ICD-10-CM

## 2022-03-13 DIAGNOSIS — K219 Gastro-esophageal reflux disease without esophagitis: Secondary | ICD-10-CM | POA: Diagnosis not present

## 2022-03-22 LAB — ADALIMUMAB+AB (SERIAL MONITOR)
Adalimumab Drug Level: 11 ug/mL
Anti-Adalimumab Antibody: <25 ng/mL

## 2022-03-22 LAB — SERIAL MONITORING

## 2022-04-01 LAB — SPECIMEN STATUS REPORT

## 2022-04-16 ENCOUNTER — Other Ambulatory Visit: Payer: Self-pay | Admitting: Internal Medicine

## 2022-05-08 ENCOUNTER — Encounter: Payer: Self-pay | Admitting: Internal Medicine

## 2022-05-18 ENCOUNTER — Other Ambulatory Visit: Payer: Self-pay | Admitting: Internal Medicine

## 2022-07-30 ENCOUNTER — Encounter: Payer: Self-pay | Admitting: Internal Medicine

## 2022-07-30 ENCOUNTER — Other Ambulatory Visit: Payer: BC Managed Care – PPO

## 2022-07-30 ENCOUNTER — Ambulatory Visit (INDEPENDENT_AMBULATORY_CARE_PROVIDER_SITE_OTHER): Payer: BC Managed Care – PPO | Admitting: Internal Medicine

## 2022-07-30 VITALS — BP 130/80 | HR 78 | Ht 70.0 in | Wt 223.0 lb

## 2022-07-30 DIAGNOSIS — K50018 Crohn's disease of small intestine with other complication: Secondary | ICD-10-CM

## 2022-07-30 DIAGNOSIS — K50119 Crohn's disease of large intestine with unspecified complications: Secondary | ICD-10-CM | POA: Diagnosis not present

## 2022-07-30 NOTE — Progress Notes (Unsigned)
   Subjective:    Patient ID: Glen Cain, male    DOB: 1985-07-14, 37 y.o.   MRN: [id number]  HPI Glen Cain is a 37 year old male with a history of ileal and perianal Crohn's disease (diagnosis December 2022, Humira and low-dose azathioprine initiated February 2023), history of perianal abscess and fistula (fistulotomy September 2022) who is here for follow-up.  He is here alone today and was last here on 02/24/2022.    Glen Cain is feeling well.  He continues Humira 40 mg every 14 days.  He is taking azathioprine 50 mg and takes this most days.  Occasional loose stools depending on diet.  No abdominal pain, no perianal pain or drainage.  No blood in stool or melena.  He has been trying to lose weight and using the Nutrisystem.  He was initially able to lose about 12 pounds but has gained some of this back.  Headaches have been less of an issue.   Review of Systems As per HPI, otherwise negative  Current Medications, Allergies, Past Medical History, Past Surgical History, Family History and Social History were reviewed in [employer] record.    Objective:   Physical Exam BP 130/80   Pulse 78   Ht 5\' 10"  (1.778 m)   Wt 223 lb (101.2 kg)   BMI 32.00 kg/m  Gen: awake, alert, NAD HEENT: anicteric  Ext: no c/c/e Neuro: nonfocal   Humira drug level 11; antibody negative Fecal calprotectin 34 nine months ago; 260 at diagnosis     Assessment & Plan:  37 year old male with a history of ileal and perianal Crohn's disease (diagnosis December 2022, Humira and low-dose azathioprine initiated February 2023), history of perianal abscess and fistula (fistulotomy September 2022) who is here for follow-up.  Ileal and perianal Crohn's disease with history of fistula --clinically he is doing very well.  His fecal calprotectin normalized on current therapy.  I would like to recheck fecal calprotectin.  His Humira level was good in December.   --continue Humira 40 mg every 2  weeks --continue aza 50 mg daily --check fecal cal; if normal okay to stop aza  1 yr followup  20 minutes total spent today including patient facing time, coordination of care, reviewing medical history/procedures/pertinent radiology studies, and documentation of the encounter.

## 2022-07-30 NOTE — Patient Instructions (Addendum)
Your provider has requested that you go to the basement level for lab work before leaving today. Press "B" on the elevator. The lab is located at the first door on the left as you exit the elevator.  Continue Humira as directed.  Continue Azathioprine.   Follow-up in 1 year, sooner if needed.   _______________________________________________________  If your blood pressure at your visit was 140/90 or greater, please contact your primary care physician to follow up on this.  _______________________________________________________  If you are age 57 or older, your body mass index should be between 23-30. Your Body mass index is 32 kg/m. If this is out of the aforementioned range listed, please consider follow up with your Primary Care Provider.  If you are age 35 or younger, your body mass index should be between 19-25. Your Body mass index is 32 kg/m. If this is out of the aformentioned range listed, please consider follow up with your Primary Care Provider.   ________________________________________________________  The [place] [facility] providers would like to encourage you to use [facility] to communicate with providers for non-urgent requests or questions.  Due to long hold times on the telephone, sending your provider a message by [facility] may be a faster and more efficient way to get a response.  Please allow 48 business hours for a response.  Please remember that this is for non-urgent requests.  _______________________________________________________  Thank you for choosing me and [facility].  Dr. Vonna Kotyk Pyrtle

## 2022-07-31 ENCOUNTER — Encounter: Payer: Self-pay | Admitting: Internal Medicine

## 2022-08-14 ENCOUNTER — Ambulatory Visit: Payer: BC Managed Care – PPO

## 2022-08-14 DIAGNOSIS — K50018 Crohn's disease of small intestine with other complication: Secondary | ICD-10-CM | POA: Diagnosis not present

## 2022-08-15 ENCOUNTER — Encounter: Payer: Self-pay | Admitting: Internal Medicine

## 2022-08-17 ENCOUNTER — Other Ambulatory Visit: Payer: Self-pay | Admitting: Internal Medicine

## 2022-08-23 LAB — CALPROTECTIN, FECAL: Calprotectin, Fecal: 58 ug/g (ref 0–120)

## 2022-09-15 ENCOUNTER — Encounter: Payer: Self-pay | Admitting: Family Medicine

## 2022-09-15 ENCOUNTER — Ambulatory Visit (INDEPENDENT_AMBULATORY_CARE_PROVIDER_SITE_OTHER): Payer: BC Managed Care – PPO | Admitting: Family Medicine

## 2022-09-15 VITALS — BP 122/82 | HR 65 | Temp 97.9°F | Ht 70.47 in | Wt 223.0 lb

## 2022-09-15 DIAGNOSIS — Z0000 Encounter for general adult medical examination without abnormal findings: Secondary | ICD-10-CM | POA: Diagnosis not present

## 2022-09-15 DIAGNOSIS — K50919 Crohn's disease, unspecified, with unspecified complications: Secondary | ICD-10-CM

## 2022-09-15 DIAGNOSIS — K5090 Crohn's disease, unspecified, without complications: Secondary | ICD-10-CM | POA: Insufficient documentation

## 2022-09-15 LAB — HEPATIC FUNCTION PANEL
ALT: 28 U/L (ref 0–53)
AST: 27 U/L (ref 0–37)
Albumin: 4.7 g/dL (ref 3.5–5.2)
Alkaline Phosphatase: 56 U/L (ref 39–117)
Bilirubin, Direct: 0.2 mg/dL (ref 0.0–0.3)
Total Bilirubin: 0.7 mg/dL (ref 0.2–1.2)
Total Protein: 8.2 g/dL (ref 6.0–8.3)

## 2022-09-15 LAB — LIPID PANEL
Cholesterol: 151 mg/dL (ref 0–200)
HDL: 39.8 mg/dL (ref >39.00)
LDL Cholesterol: 91 mg/dL (ref 0–99)
NonHDL: 111.37
Total CHOL/HDL Ratio: 4
Triglycerides: 103 mg/dL (ref 0.0–149.0)
VLDL: 20.6 mg/dL (ref 0.0–40.0)

## 2022-09-15 LAB — BASIC METABOLIC PANEL
BUN: 13 mg/dL (ref 6–23)
CO2: 26 mEq/L (ref 19–32)
Calcium: 9.3 mg/dL (ref 8.4–10.5)
Chloride: 102 mEq/L (ref 96–112)
Creatinine, Ser: 0.99 mg/dL (ref 0.40–1.50)
GFR: 96.3 mL/min (ref >60.00)
Glucose, Bld: 96 mg/dL (ref 70–99)
Potassium: 4.1 mEq/L (ref 3.5–5.1)
Sodium: 136 mEq/L (ref 135–145)

## 2022-09-15 LAB — CBC WITH DIFFERENTIAL/PLATELET
Basophils Absolute: 0 10*3/uL (ref 0.0–0.1)
Basophils Relative: 0.4 % (ref 0.0–3.0)
Eosinophils Absolute: 0.2 10*3/uL (ref 0.0–0.7)
Eosinophils Relative: 2.6 % (ref 0.0–5.0)
HCT: 46.4 % (ref 39.0–52.0)
Hemoglobin: 15.8 g/dL (ref 13.0–17.0)
Lymphocytes Relative: 41.3 % (ref 12.0–46.0)
Lymphs Abs: 2.6 10*3/uL (ref 0.7–4.0)
MCHC: 34.1 g/dL (ref 30.0–36.0)
MCV: 91 fl (ref 78.0–100.0)
Monocytes Absolute: 0.6 10*3/uL (ref 0.1–1.0)
Monocytes Relative: 9.9 % (ref 3.0–12.0)
Neutro Abs: 2.8 10*3/uL (ref 1.4–7.7)
Neutrophils Relative %: 45.8 % (ref 43.0–77.0)
Platelets: 205 10*3/uL (ref 150.0–400.0)
RBC: 5.09 Mil/uL (ref 4.22–5.81)
RDW: 13.4 % (ref 11.5–15.5)
WBC: 6.2 10*3/uL (ref 4.0–10.5)

## 2022-09-15 NOTE — Progress Notes (Signed)
Established Patient Office Visit  Subjective   Patient ID: BABAK LACK, male    DOB: 1985/07/07  Age: 37 y.o. MRN: [id number]  Chief Complaint  Patient presents with   Annual Exam    HPI   Glen Cain is seen for physical exam.  He has history of Crohn's disease with history of ileitis and perianal involvement with abscesses and fistulas.  Followed closely by GI.  Recently came off of Imuran and is currently on Humira and disease is controlled.  Takes no other medications. Generally doing well.  He has come off recent vacation and getting ready to take another week of vacation after this week.  Health maintenance reviewed:  Health Maintenance  Topic Date Due   COVID-19 Vaccine (3 - Pfizer risk series) 07/28/2019   INFLUENZA VACCINE  10/30/2022   DTaP/Tdap/Td (5 - Td or Tdap) 06/07/2029   Hepatitis C Screening  Completed   HIV Screening  Completed   HPV VACCINES  Aged Out   Social history-he is married and has 35-year-old son and 48-month-old daughter.  Stays very busy with him.  He works for [employer]  Non-smoker.  No regular alcohol.  Family history-father had prostate cancer and also history of hypertension.  No family history of premature CAD.  Review of Systems  Constitutional:  Negative for chills, fever, malaise/fatigue and weight loss.  HENT:  Negative for hearing loss.   Eyes:  Negative for blurred vision and double vision.  Respiratory:  Negative for cough and shortness of breath.   Cardiovascular:  Negative for chest pain, palpitations and leg swelling.  Gastrointestinal:  Negative for abdominal pain, blood in stool, constipation and diarrhea.  Genitourinary:  Negative for dysuria.  Skin:  Negative for rash.  Neurological:  Negative for dizziness, speech change, seizures, loss of consciousness and headaches.  Psychiatric/Behavioral:  Negative for depression.       Objective:     BP 122/82 (BP Location: Left Arm, Patient Position: Sitting, Cuff  Size: Normal)   Pulse 65   Temp 97.9 F (36.6 C) (Oral)   Ht 5' 10.47" (1.79 m)   Wt 223 lb (101.2 kg)   SpO2 98%   BMI 31.57 kg/m  BP Readings from Last 3 Encounters:  09/15/22 122/82  07/30/22 130/80  02/24/22 128/74   Wt Readings from Last 3 Encounters:  09/15/22 223 lb (101.2 kg)  07/30/22 223 lb (101.2 kg)  02/24/22 227 lb (103 kg)      Physical Exam Vitals reviewed.  Constitutional:      General: He is not in acute distress.    Appearance: He is well-developed.  HENT:     Head: Normocephalic and atraumatic.     Right Ear: External ear normal.     Left Ear: External ear normal.  Eyes:     Conjunctiva/sclera: Conjunctivae normal.     Pupils: Pupils are equal, round, and reactive to light.  Neck:     Thyroid: No thyromegaly.  Cardiovascular:     Rate and Rhythm: Normal rate and regular rhythm.     Heart sounds: Normal heart sounds. No murmur heard. Pulmonary:     Effort: No respiratory distress.     Breath sounds: No wheezing or rales.  Abdominal:     General: Bowel sounds are normal. There is no distension.     Palpations: Abdomen is soft. There is no mass.     Tenderness: There is no abdominal tenderness. There is no guarding or rebound.  Musculoskeletal:  Cervical back: Normal range of motion and neck supple.     Right lower leg: No edema.     Left lower leg: No edema.  Lymphadenopathy:     Cervical: No cervical adenopathy.  Skin:    Findings: No rash.  Neurological:     Mental Status: He is alert and oriented to person, place, and time.     Cranial Nerves: No cranial nerve deficit.      No results found for any visits on 09/15/22.  Last CBC Lab Results  Component Value Date   WBC 8.5 02/24/2022   HGB 15.2 02/24/2022   HCT 43.1 02/24/2022   MCV 90.0 02/24/2022   MCH 29.7 08/10/2020   RDW 13.2 02/24/2022   PLT 211.0 02/24/2022   Last metabolic panel Lab Results  Component Value Date   GLUCOSE 87 02/24/2022   NA 136 02/24/2022   K  3.9 02/24/2022   CL 100 02/24/2022   CO2 26 02/24/2022   BUN 19 02/24/2022   CREATININE 0.95 02/24/2022   GFRNONAA >90 06/14/2013   CALCIUM 9.3 02/24/2022   PROT 7.7 02/24/2022   ALBUMIN 4.6 02/24/2022   BILITOT 0.6 02/24/2022   ALKPHOS 57 02/24/2022   AST 19 02/24/2022   ALT 20 02/24/2022   Last lipids Lab Results  Component Value Date   CHOL 127 08/20/2021   HDL 36.50 (L) 08/20/2021   LDLCALC 72 08/20/2021   TRIG 95.0 08/20/2021   CHOLHDL 3 08/20/2021   Last hemoglobin A1c No results found for: "HGBA1C" Last thyroid functions Lab Results  Component Value Date   TSH 1.55 08/20/2021      The ASCVD Risk score (Arnett DK, et al., 2019) failed to calculate for the following reasons:   The 2019 ASCVD risk score is only valid for ages 66 to 71    Assessment & Plan:   Physical exam.  37 year old male with history of Crohn's disease involving small intestine and anal region.  Doing well currently on Humira.  -Discussed importance of regular exercise.  This is very challenging currently with his job and raising a family -Consider yearly flu vaccine -Tetanus up-to-date -Consider repeat PSA by next year with his family history of prostate cancer -Obtain follow-up labs including CBC, CMP, lipids   No follow-ups on file.    Evelena Peat, MD

## 2022-11-06 ENCOUNTER — Encounter: Payer: Self-pay | Admitting: Internal Medicine

## 2022-12-03 ENCOUNTER — Telehealth: Payer: Self-pay | Admitting: Pharmacy Technician

## 2022-12-03 ENCOUNTER — Other Ambulatory Visit (HOSPITAL_COMMUNITY): Payer: Self-pay

## 2022-12-03 NOTE — Telephone Encounter (Signed)
Pharmacy Patient Advocate Encounter   Received notification from CoverMyMeds that prior authorization for HUMIRA 40MG  is required/requested.   Insurance verification completed.   The patient is insured through [facility] .   Per test claim: PA required; PA submitted to [facility] via CoverMyMeds Key/confirmation #/EOC [id number] Status is pending

## 2023-02-16 DIAGNOSIS — L821 Other seborrheic keratosis: Secondary | ICD-10-CM | POA: Diagnosis not present

## 2023-02-16 DIAGNOSIS — D1722 Benign lipomatous neoplasm of skin and subcutaneous tissue of left arm: Secondary | ICD-10-CM | POA: Diagnosis not present

## 2023-02-16 DIAGNOSIS — D485 Neoplasm of uncertain behavior of skin: Secondary | ICD-10-CM | POA: Diagnosis not present

## 2023-02-16 DIAGNOSIS — L814 Other melanin hyperpigmentation: Secondary | ICD-10-CM | POA: Diagnosis not present

## 2023-02-16 DIAGNOSIS — D225 Melanocytic nevi of trunk: Secondary | ICD-10-CM | POA: Diagnosis not present

## 2023-02-16 DIAGNOSIS — D1721 Benign lipomatous neoplasm of skin and subcutaneous tissue of right arm: Secondary | ICD-10-CM | POA: Diagnosis not present

## 2023-02-27 ENCOUNTER — Encounter: Payer: Self-pay | Admitting: Internal Medicine

## 2023-03-10 ENCOUNTER — Other Ambulatory Visit: Payer: Self-pay

## 2023-03-10 MED ORDER — AMJEVITA 40 MG/0.4ML ~~LOC~~ SOAJ: 40.0000 mg | SUBCUTANEOUS | 11 refills | Status: AC

## 2023-03-20 ENCOUNTER — Other Ambulatory Visit: Payer: Self-pay | Admitting: Internal Medicine

## 2023-04-02 ENCOUNTER — Encounter: Payer: Self-pay | Admitting: Internal Medicine

## 2023-04-02 ENCOUNTER — Telehealth: Payer: Self-pay | Admitting: Pharmacy Technician

## 2023-04-02 NOTE — Telephone Encounter (Signed)
 Pharmacy Patient Advocate Encounter  Received notification from [facility] that Prior Authorization for Amjevita 40MG /0.4ML has been APPROVED from 04/02/23 to 09/30/23   PA #/Case ID/Reference #: (Key: [id number]) PA Case ID #: [id number]

## 2023-04-02 NOTE — Telephone Encounter (Signed)
 Pharmacy Patient Advocate Encounter   Received notification from Patient Advice Request messages that prior authorization for Amjevita  40MG /0.4ML is required/requested.   Insurance verification completed.   The patient is insured through [facility] .   Per test claim: PA required; PA submitted to above mentioned insurance via CoverMyMeds Key/confirmation #/EOC (Key: [id number]) Status is pending   Submitted as expedited.

## 2023-04-02 NOTE — Telephone Encounter (Signed)
 PA request has been Submitted. New Encounter created for follow up. For additional info see Pharmacy Prior Auth telephone encounter from 04/02/2023.

## 2023-04-02 NOTE — Telephone Encounter (Signed)
Pt aware.

## 2023-04-03 NOTE — Telephone Encounter (Signed)
 Inbound call from speciality pharmacy stating prior Berkley Harvey was approved for the wrong dosage. States it will be better to call in rather than fax. Please advise, thank you.

## 2023-04-03 NOTE — Telephone Encounter (Signed)
 Inbound call from patient, states the prior authorization was done incorrectly. He states it it was written for .8 ML instead of .4 mL. [facility] states it needs to be fixed in order for them to fill medication.

## 2023-04-06 ENCOUNTER — Other Ambulatory Visit (HOSPITAL_COMMUNITY): Payer: Self-pay

## 2023-04-06 ENCOUNTER — Telehealth: Payer: Self-pay | Admitting: Pharmacy Technician

## 2023-04-06 ENCOUNTER — Other Ambulatory Visit: Payer: Self-pay

## 2023-04-06 MED ORDER — AMJEVITA 40 MG/0.8ML ~~LOC~~ SOAJ: 40.0000 mg | SUBCUTANEOUS | 11 refills | Status: DC

## 2023-04-06 NOTE — Telephone Encounter (Signed)
 PA has been submitted as expedited via phone.

## 2023-04-06 NOTE — Telephone Encounter (Signed)
 See additional phone note.

## 2023-04-06 NOTE — Telephone Encounter (Signed)
 This is a continuation of care, he was on Humira o.42ml. Is that enough for the appeal?

## 2023-04-06 NOTE — Telephone Encounter (Signed)
 Pt.notified

## 2023-04-06 NOTE — Telephone Encounter (Signed)
 Pharmacy Patient Advocate Encounter   Received notification from Pt Calls Messages that prior authorization for AMJEVITA  40MG /0.4ML is required/requested.   Insurance verification completed.   The patient is insured through [facility] .   Per test claim: PA required; PA submitted to above mentioned insurance via Phone Key/confirmation #/EOC [id number] Status is pending  Original PA was submitted correctly but insurance approved the wrong syringe size. Called and completed PA as expedited over the phone.

## 2023-04-06 NOTE — Telephone Encounter (Signed)
 Glen Cain entered the prescription for the non citrate 40mg  0.13ml dose. He should be able to fill that now from [facility] correct? Cain tried calling [facility] but was on hold for a long time and hung up.

## 2023-04-06 NOTE — Progress Notes (Signed)
 See note from pt regarding PA.

## 2023-04-06 NOTE — Telephone Encounter (Signed)
 Not that we are aware of, he was just on the CF Humira 40mg /0.21ml dose. I entered the prescription for the 40mg /0.13ml dose. It looks like that one has been approved so he should be able to fill this one correct?

## 2023-04-06 NOTE — Telephone Encounter (Signed)
 [facility] calling, spoke with Sue Lush at [phone]. She states that the pts medication for the 40mg /0.79ml was denied. State pts insurance will not cover the .4 dose. She states an appeal would need to be done for this. Please help with this PA.

## 2023-04-06 NOTE — Telephone Encounter (Signed)
 Pharmacy Patient Advocate Encounter  Received notification from [facility] that Prior Authorization for AMJEVITA  40MG /0.4 ML has been DENIED.  Full denial letter will be uploaded to the media tab. See denial reason below.   PA #/Case ID/Reference #: [id number]

## 2023-04-15 ENCOUNTER — Other Ambulatory Visit (HOSPITAL_COMMUNITY): Payer: Self-pay

## 2023-05-07 DIAGNOSIS — L905 Scar conditions and fibrosis of skin: Secondary | ICD-10-CM | POA: Diagnosis not present

## 2023-05-07 DIAGNOSIS — D225 Melanocytic nevi of trunk: Secondary | ICD-10-CM | POA: Diagnosis not present

## 2023-05-07 DIAGNOSIS — D485 Neoplasm of uncertain behavior of skin: Secondary | ICD-10-CM | POA: Diagnosis not present

## 2023-05-12 ENCOUNTER — Encounter: Payer: Self-pay | Admitting: Internal Medicine

## 2023-05-14 ENCOUNTER — Encounter: Payer: Self-pay | Admitting: Internal Medicine

## 2023-05-20 NOTE — Telephone Encounter (Signed)
Issue resolved and he has worked out the med with his insurance to continue adalimumab-atto 40 mg

## 2023-06-19 NOTE — Telephone Encounter (Signed)
 Pharmacy Patient Advocate Encounter  Received notification from [facility] that Prior Authorization for HUMIRA 40MG  has been APPROVED from 9.4.24 to 9.4.25   PA #/Case ID/Reference #: [id number]

## 2023-08-10 ENCOUNTER — Encounter: Payer: Self-pay | Admitting: Family Medicine

## 2023-08-10 ENCOUNTER — Telehealth: Payer: Self-pay | Admitting: Internal Medicine

## 2023-08-10 ENCOUNTER — Ambulatory Visit (INDEPENDENT_AMBULATORY_CARE_PROVIDER_SITE_OTHER): Admitting: Family Medicine

## 2023-08-10 VITALS — BP 120/80 | HR 78 | Temp 98.3°F | Wt 231.3 lb

## 2023-08-10 DIAGNOSIS — F418 Other specified anxiety disorders: Secondary | ICD-10-CM

## 2023-08-10 DIAGNOSIS — L259 Unspecified contact dermatitis, unspecified cause: Secondary | ICD-10-CM | POA: Diagnosis not present

## 2023-08-10 MED ORDER — METHYLPREDNISOLONE ACETATE 80 MG/ML IJ SUSP
80.0000 mg | Freq: Once | INTRAMUSCULAR | Status: AC
Start: 2023-08-10 — End: 2023-08-10
  Administered 2023-08-10: 80 mg via INTRAMUSCULAR

## 2023-08-10 MED ORDER — PREDNISONE 10 MG PO TABS
ORAL_TABLET | ORAL | 0 refills | Status: DC
Start: 2023-08-10 — End: 2023-10-13

## 2023-08-10 MED ORDER — ESCITALOPRAM OXALATE 10 MG PO TABS
10.0000 mg | ORAL_TABLET | Freq: Every day | ORAL | 5 refills | Status: DC
Start: 2023-08-10 — End: 2023-12-08

## 2023-08-10 NOTE — Telephone Encounter (Signed)
 Inbound call from patient stating he has a poison ivy and would like to make sure that prednisone will not interfere with his Crohn medication. Requesting a call back. Please advise, thank you

## 2023-08-10 NOTE — Telephone Encounter (Signed)
 Patient advised that prednisone for his poison ivy should not cause any issues with his amjevita  injection.

## 2023-08-10 NOTE — Progress Notes (Signed)
 Established Patient Office Visit  Subjective   Patient ID: Glen Cain, male    DOB: Aug 21, 1985  Age: 38 y.o. MRN: [id number]  Chief Complaint  Patient presents with   Poison Ivy    HPI   Glen Cain is here today for the following items  Pruritic vesicular rash which he first noticed around last Wednesday after working in the yard some the weekend prior.  Predominant involvement of his right cheek and right neck area but also some on the arms.  He has had poison ivy in the past with similar outbreak.  Has taken prednisone previously.  He does have history of Crohn's disease and is currently being treated with Amjevita  injections every 14 days.  Other issue is current home stress.  He has a almost 70-year-old and 76-year-old increased stress with parenting and also occasional interactions with his spouse.  He has had some counseling through EAP counseling with work.  He feels he can get agitated easily at times.  He is inquiring about possible medication options to help with his anger   Past Medical History:  Diagnosis Date   Anal fissure    Benign neoplasm of skin, site unspecified 08/22/2009   Crohn's disease (HCC)    Internal hemorrhoids    Peri-rectal abscess    Past Surgical History:  Procedure Laterality Date   ARTHROSCOPIC REPAIR ACL Right 03/31/2005   CHOLECYSTECTOMY  02/28/2014   EVALUATION UNDER ANESTHESIA WITH HEMORRHOIDECTOMY N/A 08/16/2020   Procedure: ANORECTAL EXAM UNDER ANESTHESIA, anterior superficial fistulotomy with marsupialization, excision of posterior anal sinus tract, excision of anal tag;  Surgeon: Glen Champagne, MD;  Location: [facility];  Service: General;  Laterality: N/A;   KNEE ARTHROSCOPY Right 06/16/2013   Procedure: RIGHT ARTHROSCOPY KNEE MENISECTOMY WITH DEBRIDEMENT ;  Surgeon: Glen Larch, MD;  Location: [facility] OR;  Service: Orthopedics;  Laterality: Right;   rectal fissure     With abscess   WISDOM TOOTH EXTRACTION      reports that he quit smoking  about 14 years ago. His smoking use included cigarettes. He has quit using smokeless tobacco. He reports current alcohol use. He reports that he does not use drugs. family history includes Cancer in an other family member; Diabetes in an other family member; Hypertension in his father and another family member; Stroke in an other family member. No Known Allergies  Review of Systems  Constitutional:  Negative for chills and fever.  Skin:  Positive for itching and rash.  Psychiatric/Behavioral:  Negative for depression. The patient is nervous/anxious.       Objective:     BP 120/80 (BP Location: Left Arm, Patient Position: Sitting, Cuff Size: Large)   Pulse 78   Temp 98.3 F (36.8 C) (Oral)   Wt 231 lb 4.8 oz (104.9 kg)   SpO2 96%   BMI 32.75 kg/m  BP Readings from Last 3 Encounters:  08/10/23 120/80  09/15/22 122/82  07/30/22 130/80   Wt Readings from Last 3 Encounters:  08/10/23 231 lb 4.8 oz (104.9 kg)  09/15/22 223 lb (101.2 kg)  07/30/22 223 lb (101.2 kg)      Physical Exam Vitals reviewed.  Constitutional:      General: He is not in acute distress.    Appearance: He is not ill-appearing.  Skin:    Findings: Rash present.     Comments: Multiple scattered areas of rash predominantly right cheek but also posterior neck and arms bilaterally.  He has some erythema with superficial  vesiculation some in linear distribution.  Consistent with classic contact dermatitis  Neurological:     Mental Status: He is alert.      No results found for any visits on 08/10/23.    The ASCVD Risk score (Arnett DK, et al., 2019) failed to calculate for the following reasons:   The 2019 ASCVD risk score is only valid for ages 69 to 27    Assessment & Plan:   #1 contact dermatitis rash.  Significant diffuse involvement.  He has responded well in the past to combination therapy of steroid injection and oral.  Depo-Medrol 80 mg IM given.  Start prednisone taper 40 mg daily and taper  over 11 days.  Follow-up for any persistent or worsening symptoms  #2 relational stress/anxiety.  Recent stresses of parenting and this has created some marital stress as well.  They are looking at possible couples counseling. He has had some counseling through EAP counselor with work.  We did discuss possible initiation of Lexapro 10 mg once daily and he will follow-up December for physical and give feedback sooner if not seeing some improvements over the next few weeks   No follow-ups on file.    Glen Lamy, MD

## 2023-08-17 ENCOUNTER — Encounter: Payer: Self-pay | Admitting: Family Medicine

## 2023-09-29 ENCOUNTER — Encounter: Admitting: Family Medicine

## 2023-10-13 ENCOUNTER — Ambulatory Visit: Payer: Self-pay | Admitting: Family Medicine

## 2023-10-13 ENCOUNTER — Ambulatory Visit (INDEPENDENT_AMBULATORY_CARE_PROVIDER_SITE_OTHER): Admitting: Family Medicine

## 2023-10-13 ENCOUNTER — Encounter: Payer: Self-pay | Admitting: Family Medicine

## 2023-10-13 VITALS — BP 134/86 | HR 71 | Temp 98.3°F | Ht 70.87 in | Wt 228.6 lb

## 2023-10-13 DIAGNOSIS — Z0000 Encounter for general adult medical examination without abnormal findings: Secondary | ICD-10-CM | POA: Diagnosis not present

## 2023-10-13 LAB — CBC WITH DIFFERENTIAL/PLATELET
Basophils Absolute: 0 K/uL (ref 0.0–0.1)
Basophils Relative: 0.4 % (ref 0.0–3.0)
Eosinophils Absolute: 0.2 K/uL (ref 0.0–0.7)
Eosinophils Relative: 2.5 % (ref 0.0–5.0)
HCT: 44.4 % (ref 39.0–52.0)
Hemoglobin: 15.6 g/dL (ref 13.0–17.0)
Lymphocytes Relative: 36.8 % (ref 12.0–46.0)
Lymphs Abs: 2.5 K/uL (ref 0.7–4.0)
MCHC: 35.2 g/dL (ref 30.0–36.0)
MCV: 89.8 fl (ref 78.0–100.0)
Monocytes Absolute: 0.6 K/uL (ref 0.1–1.0)
Monocytes Relative: 8.8 % (ref 3.0–12.0)
Neutro Abs: 3.5 K/uL (ref 1.4–7.7)
Neutrophils Relative %: 51.5 % (ref 43.0–77.0)
Platelets: 208 K/uL (ref 150.0–400.0)
RBC: 4.94 Mil/uL (ref 4.22–5.81)
RDW: 13.4 % (ref 11.5–15.5)
WBC: 6.8 K/uL (ref 4.0–10.5)

## 2023-10-13 LAB — LIPID PANEL
Cholesterol: 169 mg/dL (ref 0–200)
HDL: 42.2 mg/dL (ref >39.00)
LDL Cholesterol: 98 mg/dL (ref 0–99)
NonHDL: 127.08
Total CHOL/HDL Ratio: 4
Triglycerides: 143 mg/dL (ref 0.0–149.0)
VLDL: 28.6 mg/dL (ref 0.0–40.0)

## 2023-10-13 LAB — BASIC METABOLIC PANEL WITH GFR
BUN: 12 mg/dL (ref 6–23)
CO2: 29 meq/L (ref 19–32)
Calcium: 9.4 mg/dL (ref 8.4–10.5)
Chloride: 103 meq/L (ref 96–112)
Creatinine, Ser: 0.98 mg/dL (ref 0.40–1.50)
GFR: 96.75 mL/min (ref >60.00)
Glucose, Bld: 92 mg/dL (ref 70–99)
Potassium: 4.3 meq/L (ref 3.5–5.1)
Sodium: 139 meq/L (ref 135–145)

## 2023-10-13 LAB — HEPATIC FUNCTION PANEL
ALT: 20 U/L (ref 0–53)
AST: 16 U/L (ref 0–37)
Albumin: 4.6 g/dL (ref 3.5–5.2)
Alkaline Phosphatase: 61 U/L (ref 39–117)
Bilirubin, Direct: 0.2 mg/dL (ref 0.0–0.3)
Total Bilirubin: 0.9 mg/dL (ref 0.2–1.2)
Total Protein: 7.4 g/dL (ref 6.0–8.3)

## 2023-10-13 LAB — PSA: PSA: 0.89 ng/mL (ref 0.10–4.00)

## 2023-10-13 NOTE — Progress Notes (Signed)
 Established Patient Office Visit  Subjective   Patient ID: Glen Cain, male    DOB: [birth year]  Age: 39 y.o. MRN: [id number]  Chief Complaint  Patient presents with   Annual Exam    HPI   Glen Cain is seen today for physical exam.  He has Crohn's disease followed by GI and currently treated with Amjevita  injections every 2 weeks.  His Crohn's disease has been stable.  Colonoscopy was 2022.  We recently started Lexapro  for some anxiety symptoms and he did have some nausea when he first started this.  He started back half tablet for about 4 days then has progressed up to 10 mg and tolerating well.  Just started this couple weeks ago.  Has not seen a lot of change thus far  Health maintenance reviewed:  Health Maintenance  Topic Date Due   Hepatitis B Vaccines (2 of 3 - 19+ 3-dose series) 10/23/2009   HPV VACCINES (1 - Risk 3-dose SCDM series) Never done   COVID-19 Vaccine (3 - Pfizer risk series) 07/28/2019   INFLUENZA VACCINE  10/30/2023   DTaP/Tdap/Td (5 - Td or Tdap) 06/07/2029   Hepatitis C Screening  Completed   HIV Screening  Completed   Meningococcal B Vaccine  Aged Out   Family history significant for father having prostate cancer and hypertension.  No family history of premature CAD.  Social history-married with 2 children.  Works for [employer]  Never smoked.  No regular alcohol.  Has recently started back exercising some at work.  Past Medical History:  Diagnosis Date   Anal fissure    Benign neoplasm of skin, site unspecified 08/22/2009   Crohn's disease (HCC)    Internal hemorrhoids    Peri-rectal abscess    Past Surgical History:  Procedure Laterality Date   ARTHROSCOPIC REPAIR ACL Right 03/31/2005   CHOLECYSTECTOMY  02/28/2014   EVALUATION UNDER ANESTHESIA WITH HEMORRHOIDECTOMY N/A 08/16/2020   Procedure: ANORECTAL EXAM UNDER ANESTHESIA, anterior superficial fistulotomy with marsupialization, excision of posterior anal sinus tract, excision  of anal tag;  Surgeon: Sheldon Standing, MD;  Location: [facility];  Service: General;  Laterality: N/A;   KNEE ARTHROSCOPY Right 06/16/2013   Procedure: RIGHT ARTHROSCOPY KNEE MENISECTOMY WITH DEBRIDEMENT ;  Surgeon: Franky CHRISTELLA Pointer, MD;  Location: [facility];  Service: Orthopedics;  Laterality: Right;   rectal fissure     With abscess   WISDOM TOOTH EXTRACTION      reports that he quit smoking about 14 years ago. His smoking use included cigarettes. He has quit using smokeless tobacco. He reports current alcohol use. He reports that he does not use drugs. family history includes Cancer in an other family member; Diabetes in an other family member; Hypertension in his father and another family member; Stroke in an other family member. No Known Allergies   Review of Systems  Constitutional:  Negative for chills, fever, malaise/fatigue and weight loss.  HENT:  Negative for hearing loss.   Eyes:  Negative for blurred vision and double vision.  Respiratory:  Negative for cough and shortness of breath.   Cardiovascular:  Negative for chest pain, palpitations and leg swelling.  Gastrointestinal:  Negative for abdominal pain, blood in stool, constipation and diarrhea.  Genitourinary:  Negative for dysuria.  Skin:  Negative for rash.  Neurological:  Negative for dizziness, speech change, seizures, loss of consciousness and headaches.  Psychiatric/Behavioral:  Negative for depression.       Objective:     BP 134/86 (  BP Location: Left Arm, Patient Position: Sitting, Cuff Size: Normal)   Pulse 71   Temp 98.3 F (36.8 C) (Oral)   Ht 5' 10.87 (1.8 m)   Wt 228 lb 9.6 oz (103.7 kg)   SpO2 96%   BMI 32.00 kg/m  BP Readings from Last 3 Encounters:  10/13/23 134/86  08/10/23 120/80  09/15/22 122/82   Wt Readings from Last 3 Encounters:  10/13/23 228 lb 9.6 oz (103.7 kg)  08/10/23 231 lb 4.8 oz (104.9 kg)  09/15/22 223 lb (101.2 kg)      Physical Exam Vitals reviewed.  Constitutional:       General: He is not in acute distress.    Appearance: He is well-developed.  HENT:     Head: Normocephalic and atraumatic.     Right Ear: External ear normal.     Left Ear: External ear normal.  Eyes:     Conjunctiva/sclera: Conjunctivae normal.     Pupils: Pupils are equal, round, and reactive to light.  Neck:     Thyroid : No thyromegaly.  Cardiovascular:     Rate and Rhythm: Normal rate and regular rhythm.     Heart sounds: Normal heart sounds. No murmur heard. Pulmonary:     Effort: No respiratory distress.     Breath sounds: No wheezing or rales.  Abdominal:     General: Bowel sounds are normal. There is no distension.     Palpations: Abdomen is soft. There is no mass.     Tenderness: There is no abdominal tenderness. There is no guarding or rebound.  Musculoskeletal:     Cervical back: Normal range of motion and neck supple.  Lymphadenopathy:     Cervical: No cervical adenopathy.  Skin:    Findings: No rash.     Comments: Scar right upper chest wall from previous excisional biopsy per dermatology  Neurological:     Mental Status: He is alert and oriented to person, place, and time.     Cranial Nerves: No cranial nerve deficit.      No results found for any visits on 10/13/23.    The 10-year ASCVD risk score (Arnett DK, et al., 2019) is: 1%    Assessment & Plan:   Problem List Items Addressed This Visit   None Visit Diagnoses       Physical exam    -  Primary   Relevant Orders   Basic metabolic panel with GFR   Lipid panel   CBC with Differential/Platelet   Hepatic function panel   PSA     38 year old male here for physical exam.  He has made some recent positive lifestyle changes with exercise and scaling back calories and has lost about 3 pounds since last year.  He hopes to lose more.  He had colonoscopy 2022 and has history of Crohn's disease as above.  -Recommend annual flu vaccine - Recommend continued regular exercise habits - Obtain labs as  above - Checking PSA with his family history of prostate cancer  No follow-ups on file.    Wolm Scarlet, MD

## 2023-12-08 ENCOUNTER — Other Ambulatory Visit: Payer: Self-pay | Admitting: Family Medicine

## 2023-12-11 ENCOUNTER — Other Ambulatory Visit (HOSPITAL_COMMUNITY): Payer: Self-pay

## 2023-12-15 ENCOUNTER — Other Ambulatory Visit (HOSPITAL_COMMUNITY): Payer: Self-pay

## 2023-12-15 ENCOUNTER — Telehealth: Payer: Self-pay

## 2023-12-15 NOTE — Telephone Encounter (Signed)
 Pharmacy Patient Advocate Encounter   Received notification from CoverMyMeds that prior authorization for Amjevita  40MG /0.8ML auto-injectors is required/requested.   Insurance verification completed.   The patient is insured through [facility] .   Per test claim: PA required; PA submitted to above mentioned insurance via Latent Key/confirmation #/EOC [id number] Status is pending

## 2023-12-16 ENCOUNTER — Other Ambulatory Visit (HOSPITAL_COMMUNITY): Payer: Self-pay

## 2023-12-16 NOTE — Telephone Encounter (Signed)
 Pharmacy Patient Advocate Encounter  Received notification from [facility] that Prior Authorization for  Amjevita  40MG /0.8ML auto-injectors has been APPROVED from 12-15-2023 to 12-14-2024   PA #/Case ID/Reference #: [id number] / [id number]  Must fill at [facility]

## 2023-12-29 ENCOUNTER — Other Ambulatory Visit (HOSPITAL_COMMUNITY): Payer: Self-pay

## 2023-12-29 ENCOUNTER — Telehealth: Payer: Self-pay | Admitting: Internal Medicine

## 2023-12-29 MED ORDER — AMJEVITA 40 MG/0.4ML ~~LOC~~ SOAJ: 40.0000 mg | SUBCUTANEOUS | 6 refills | Status: AC

## 2023-12-29 NOTE — Telephone Encounter (Signed)
 PA was submitted and approved for the 40 MG/ 0.8 ML  Test claim returns a co-pay of $100.00

## 2023-12-29 NOTE — Telephone Encounter (Signed)
 Patient returning call please advise. Thank you.

## 2023-12-29 NOTE — Addendum Note (Signed)
 Addended by: PERLEY HANDING R on: 12/29/2023 12:05 PM   Modules accepted: Orders

## 2023-12-29 NOTE — Telephone Encounter (Signed)
 Based on test claim, the 0.70mL is too soon to fill until 10-09

## 2023-12-29 NOTE — Telephone Encounter (Signed)
 Pt states he got a letter from insurance that they are not covering the 40mg /.86ml. Pt even stated that they don't make the .8ml dose anymore. He has gotten the .4ml dose without issue. That is why he requested the 40mg /.32ml dose be sent in for him.

## 2023-12-29 NOTE — Telephone Encounter (Signed)
 Left message for pt to call back.  Pt got letter from [employer] stating amjevita  40mg /.22ml is not covered. Needs script sent in for 40mg /.45ml. Script sent to pharmacy as

## 2023-12-29 NOTE — Telephone Encounter (Signed)
 New script sent to pharmacy per pt request to see if 40mg /.24ml dose is covered.

## 2023-12-29 NOTE — Telephone Encounter (Signed)
 Inbound call from patient stating he received a letter in the mail stating medication Amjevita  was denied by his insurance. Patient states he believes it due to it changing from dosage 40mg /0.4 to 40mg /0.8. Patient would like to speak to nurse  Requesting  a call back  Please advise  Thank you

## 2023-12-29 NOTE — Telephone Encounter (Signed)
 Spoke with pt and he states he has several pens in his refrigerator so he will be good until the next fill date of 10/9.

## 2024-02-16 DIAGNOSIS — D1801 Hemangioma of skin and subcutaneous tissue: Secondary | ICD-10-CM | POA: Diagnosis not present

## 2024-02-16 DIAGNOSIS — L814 Other melanin hyperpigmentation: Secondary | ICD-10-CM | POA: Diagnosis not present

## 2024-02-16 DIAGNOSIS — L821 Other seborrheic keratosis: Secondary | ICD-10-CM | POA: Diagnosis not present

## 2024-02-16 DIAGNOSIS — Z09 Encounter for follow-up examination after completed treatment for conditions other than malignant neoplasm: Secondary | ICD-10-CM | POA: Diagnosis not present

## 2024-03-16 ENCOUNTER — Ambulatory Visit: Admitting: Family Medicine

## 2024-03-16 ENCOUNTER — Ambulatory Visit

## 2024-03-16 ENCOUNTER — Encounter: Payer: Self-pay | Admitting: Family Medicine

## 2024-03-16 VITALS — BP 134/90 | HR 73 | Temp 98.1°F | Wt 234.3 lb

## 2024-03-16 DIAGNOSIS — M25532 Pain in left wrist: Secondary | ICD-10-CM

## 2024-03-16 NOTE — Patient Instructions (Signed)
 Continue with icing few times daily and wrist splint and let me know in one week if not improving.

## 2024-03-16 NOTE — Progress Notes (Signed)
 Established Patient Office Visit  Subjective   Patient ID: Glen Cain, male    DOB: 07-Aug-1985  Age: 38 y.o. MRN: [id number]  Chief Complaint  Patient presents with   Wrist Injury    HPI    Glen Cain is seen with left wrist pain.  He is left-hand dominant.  He states he generally sleeps with his wrist curled up in somewhat of a flexed posture.  Yesterday morning he woke up and felt a pop when moving the left wrist.  There was no specific injury.  Since that time he has noticed some swelling and pain with extension of the wrist and flexion of the fingers.  Has applied some ice.  Also went out and got rigid wrist brace which she has been wearing intermittently.  Is been taking some Tylenol .  Avoids nonsteroidals because of his history of Crohn's disease.  Past Medical History:  Diagnosis Date   Anal fissure    Benign neoplasm of skin, site unspecified 08/22/2009   Crohn's disease (HCC)    Internal hemorrhoids    Peri-rectal abscess    Past Surgical History:  Procedure Laterality Date   ARTHROSCOPIC REPAIR ACL Right 03/31/2005   CHOLECYSTECTOMY  02/28/2014   EVALUATION UNDER ANESTHESIA WITH HEMORRHOIDECTOMY N/A 08/16/2020   Procedure: ANORECTAL EXAM UNDER ANESTHESIA, anterior superficial fistulotomy with marsupialization, excision of posterior anal sinus tract, excision of anal tag;  Surgeon: Sheldon Standing, MD;  Location: [facility];  Service: General;  Laterality: N/A;   KNEE ARTHROSCOPY Right 06/16/2013   Procedure: RIGHT ARTHROSCOPY KNEE MENISECTOMY WITH DEBRIDEMENT ;  Surgeon: Franky CHRISTELLA Pointer, MD;  Location: [facility];  Service: Orthopedics;  Laterality: Right;   rectal fissure     With abscess   WISDOM TOOTH EXTRACTION      reports that he quit smoking about 14 years ago. His smoking use included cigarettes. He has quit using smokeless tobacco. He reports current alcohol use. He reports that he does not use drugs. family history includes Cancer in an other family member; Diabetes  in an other family member; Hypertension in his father and another family member; Stroke in an other family member. Allergies[1]  Review of Systems  Neurological:  Negative for focal weakness.      Objective:     BP (!) 134/90   Pulse 73   Temp 98.1 F (36.7 C) (Oral)   Wt 234 lb 4.8 oz (106.3 kg)   SpO2 97%   BMI 32.80 kg/m  BP Readings from Last 3 Encounters:  03/16/24 (!) 134/90  10/13/23 134/86  08/10/23 120/80   Wt Readings from Last 3 Encounters:  03/16/24 234 lb 4.8 oz (106.3 kg)  10/13/23 228 lb 9.6 oz (103.7 kg)  08/10/23 231 lb 4.8 oz (104.9 kg)      Physical Exam Vitals reviewed.  Constitutional:      General: He is not in acute distress.    Appearance: He is not ill-appearing.  Cardiovascular:     Rate and Rhythm: Normal rate and regular rhythm.  Musculoskeletal:     Comments: Left wrist reveals some soft tissue edema near the wrist joint.  He is able to flex and extend the wrist though he has significant pain with wrist extension.  He is able to fully move all digits of the hand.  Has some mild distal radial tenderness.  No navicular tenderness.  Neurological:     Mental Status: He is alert.      No results found for any visits  on 03/16/24.    The 10-year ASCVD risk score (Arnett DK, et al., 2019) is: 1.1%    Assessment & Plan:   Problem List Items Addressed This Visit   None Visit Diagnoses       Left wrist pain    -  Primary   Relevant Orders   DG Wrist Complete Left     1 day history of left wrist pain.  Suspect extensor tendon strain.  Obtain x-ray left wrist.  If negative, continue icing and wrist splint as needed.  Touch base if not improving over the next week.   No follow-ups on file.    Wolm Scarlet, MD     [1] No Known Allergies

## 2024-03-17 ENCOUNTER — Encounter: Payer: Self-pay | Admitting: Family Medicine

## 2024-03-28 ENCOUNTER — Other Ambulatory Visit: Payer: Self-pay | Admitting: Internal Medicine

## 2024-04-04 ENCOUNTER — Encounter: Payer: Self-pay | Admitting: Family Medicine

## 2024-04-04 ENCOUNTER — Ambulatory Visit: Payer: Self-pay | Admitting: Family Medicine
